# Patient Record
Sex: Female | Born: 1995 | Race: White | Hispanic: No | Marital: Single | Smoking: Never smoker
Health system: Southern US, Community
[De-identification: ages and names within clinical notes are randomized; demographics above are authoritative.]

## PROBLEM LIST (undated history)

## (undated) DIAGNOSIS — J45909 Unspecified asthma, uncomplicated: Secondary | ICD-10-CM

## (undated) DIAGNOSIS — L309 Dermatitis, unspecified: Secondary | ICD-10-CM

## (undated) HISTORY — DX: Dermatitis, unspecified: L30.9

## (undated) HISTORY — PX: NO PAST SURGERIES: SHX2092

---
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
## 2017-01-02 ENCOUNTER — Ambulatory Visit: Payer: BLUE CROSS/BLUE SHIELD | Admitting: Internal Medicine

## 2017-01-04 ENCOUNTER — Ambulatory Visit (INDEPENDENT_AMBULATORY_CARE_PROVIDER_SITE_OTHER): Payer: BLUE CROSS/BLUE SHIELD | Admitting: Internal Medicine

## 2017-01-04 ENCOUNTER — Encounter: Payer: Self-pay | Admitting: Internal Medicine

## 2017-01-04 VITALS — BP 118/70 | HR 87 | Temp 97.9°F | Wt 107.0 lb

## 2017-01-04 DIAGNOSIS — N926 Irregular menstruation, unspecified: Secondary | ICD-10-CM | POA: Diagnosis not present

## 2017-01-04 MED ORDER — NORETHIN ACE-ETH ESTRAD-FE 1-20 MG-MCG PO TABS: 1.0000 | ORAL_TABLET | Freq: Every day | ORAL | 11 refills | Status: DC

## 2017-01-04 NOTE — Patient Instructions (Signed)
Premenstrual Syndrome Premenstrual syndrome (PMS) is a condition that consists of physical, emotional, and behavioral symptoms that affect women of childbearing age. PMS occurs 5-14 days before the start of a menstrual period and often recurs in a predictable pattern. The symptoms go away a few days after the menstrual period starts. PMS can interfere in many ways with normal daily activities and can range from mild to severe. When PMS is considered severe, it may be diagnosed as premenstrual dysphoric disorder (PMDD). A small percentage of women are affected by PMS symptoms and an even smaller percentage of those women are affected by PMDD.  CAUSES  The exact cause of PMS is unknown, but it seems to be related to cyclic hormone changes that happen before menstruation. These hormones are thought to affect chemicals in the brain (serotonin) that can influence a person's mood.  SYMPTOMS  Symptoms of PMS recur consistently from month to month and go away completely after the menstrual period starts. The most common emotional or behavioral symptom is mood swings. These mood swings can be disabling and interfere with normal activities of daily living. Other common symptoms include depression and angry outbursts. Other symptoms may include:   Irritability.  Anxiety.  Crying spells.   Food cravings or appetite changes.   Changes in sexual desire.   Confusion.   Aggression.   Social withdrawal.   Poor concentration. The most common physical symptoms include a sense of bloating, breast pain, headaches, and extreme fatigue. Other physical symptoms include:   Backaches.   Swelling of the hands and feet.   Weight gain.   Hot flashes.  DIAGNOSIS  To make a diagnosis, your caregiver will ask questions to confirm that you are having a pattern of symptoms. Symptoms must:   Be present 5 days before the start of your period and be present at least 3 months in a row.   End within 4 days  after your period starts.   Interfere with some of your normal activities.  Other conditions, such as thyroid disease, depression, and migraine headaches must be ruled out before a diagnosis of PMS is confirmed.  TREATMENT  Your caregiver may suggest ways to maintain a healthy lifestyle, such as exercise. Over-the-counter pain relievers may ease cramps, aches, pains, headaches, and breast tenderness. However, selective serotonin reuptake inhibitors (SSRIs) are medicines that are most beneficial in improving PMS if taken in the second half of the monthly cycle. They may be taken on a daily basis. The most effective oral contraceptive pill used for symptoms of PMS is one that contains the ingredient drospirenone. Taking 4 days off of the pill instead of the usual 7 days also has shown to increase effectiveness.  There are a number of drugs, dietary supplements, vitamins, and water pills (diuretics) which have been suggested to be helpful but have not shown to be of any benefit to improving PMS symptoms.  HOME CARE INSTRUCTIONS   For 2-3 months, write down your symptoms, their severity, and how long they last. This may help your caregiver prescribe the best treatment for your symptoms.  Exercise regularly as suggested by your caregiver.  Eat a regular, well-balanced diet.  Avoid caffeine, alcohol, and tobacco consumption.  Limit salt and salty foods to lessen bloating and fluid retention.  Get enough sleep. Practice relaxation techniques.  Drink enough fluids to keep your urine clear or pale yellow.  Take medicines as directed by your caregiver.  Limit stress.  Take a multivitamin as directed by your   caregiver. This information is not intended to replace advice given to you by your health care provider. Make sure you discuss any questions you have with your health care provider. Document Released: 11/17/2000 Document Revised: 08/14/2012 Document Reviewed: 08/20/2015 Elsevier Interactive  Patient Education  2017 Elsevier Inc.  

## 2017-01-04 NOTE — Progress Notes (Signed)
   Subjective:    Patient ID: Alexis GrayerSophia A Bolton, female    DOB: 01/10/1996, 21 y.o.   MRN: [id number]  HPI  Pt presents to the clinic today with c/o abdominal cramping and nausea. This only occurs during her menstrual cycle. Her periods are regular. She does not have heavy bleeding. Her periods usually last 5-6 days, and reports the cramping and nausea is worst the first and second days. She has vomited and felt faint a few times from the pain. She has tried Ibuprofen and Midol OTC with minimal relief. She reports she is not sexually active at this time.  Review of Systems      Past Medical History:  Diagnosis Date  . Eczema     Current Outpatient Prescriptions  Medication Sig Dispense Refill  . levocetirizine (XYZAL) 5 MG tablet Take 5 mg by mouth every evening.  0  . olopatadine (PATANOL) 0.1 % ophthalmic solution Place 1 drop into both eyes 2 (two) times daily. 5 mL 0  . Pediatric Multiple Vit-C-FA (CHILDRENS MULTIVITAMIN PO) Take 1 tablet by mouth daily.       No current facility-administered medications for this visit.     No Known Allergies  No family history on file.  Social History   Social History  . Marital status: Single    Spouse name: N/A  . Number of children: N/A  . Years of education: N/A   Occupational History  . Not on file.   Social History Main Topics  . Smoking status: Never Smoker  . Smokeless tobacco: Never Used  . Alcohol use No  . Drug use: No  . Sexual activity: Not on file   Other Topics Concern  . Not on file   Social History Narrative   Lives with mom, dad and sister     Constitutional: Denies fever, malaise, fatigue, headache or abrupt weight changes.  Gastrointestinal: Pt reports pelvic cramping and nausea .Denies abdominal pain, bloating, constipation, diarrhea or blood in the stool.  GU: Denies urgency, frequency, pain with urination, burning sensation, blood in urine, odor or discharge.  No other specific complaints in a  complete review of systems (except as listed in HPI above).  Objective:   Physical Exam    BP 118/70   Pulse 87   Temp 97.9 F (36.6 C) (Oral)   Wt 107 lb (48.5 kg)   LMP 01/03/2017   SpO2 98%   BMI 20.55 kg/m  Wt Readings from Last 3 Encounters:  01/04/17 107 lb (48.5 kg) (13 %, Z= -1.10)*  09/14/16 105 lb 6.4 oz (47.8 kg) (12 %, Z= -1.19)*  07/31/16 104 lb 4 oz (47.3 kg) (10 %, Z= -1.26)*   * Growth percentiles are based on CDC 2-20 Years data.    General: Appears her stated age, well developed, well nourished in NAD. Abdomen: Soft and nontender.      Assessment & Plan:   Menstrual problem:  Discussed treatment with OCP She is agreeable to this, understands the risks vs benefits eRx for Junel, will start this Sunday (she is currently on her period)  Follow up with me in 3 months, sooner if needed Nicki ReaperBAITY, Katrina Daddona, NP

## 2017-01-25 ENCOUNTER — Telehealth: Payer: Self-pay

## 2017-01-25 NOTE — Telephone Encounter (Signed)
Pt left v/m pt seen 01/04/17; pt has question about BC; if pt misses BC pill and then pt takes the missed dose pt as dried up blood like aftermath of menstrual period and pt wants to know if she should be concerned. Pt request cb.

## 2017-01-26 NOTE — Telephone Encounter (Signed)
She should not be concerned. This is normal with a missed dose. However, if she misses a dose, she should not try to take it when she remembers, she should just wait for the next dose.

## 2017-01-26 NOTE — Telephone Encounter (Signed)
Left message on voicemail.

## 2017-03-27 DIAGNOSIS — J301 Allergic rhinitis due to pollen: Secondary | ICD-10-CM | POA: Diagnosis not present

## 2017-03-27 DIAGNOSIS — J3081 Allergic rhinitis due to animal (cat) (dog) hair and dander: Secondary | ICD-10-CM | POA: Diagnosis not present

## 2017-03-27 DIAGNOSIS — H1045 Other chronic allergic conjunctivitis: Secondary | ICD-10-CM | POA: Diagnosis not present

## 2017-03-27 DIAGNOSIS — J3089 Other allergic rhinitis: Secondary | ICD-10-CM | POA: Diagnosis not present

## 2017-10-19 ENCOUNTER — Other Ambulatory Visit: Payer: Self-pay | Admitting: Internal Medicine

## 2018-03-26 DIAGNOSIS — J3089 Other allergic rhinitis: Secondary | ICD-10-CM | POA: Diagnosis not present

## 2018-03-26 DIAGNOSIS — J3081 Allergic rhinitis due to animal (cat) (dog) hair and dander: Secondary | ICD-10-CM | POA: Diagnosis not present

## 2018-03-26 DIAGNOSIS — H1045 Other chronic allergic conjunctivitis: Secondary | ICD-10-CM | POA: Diagnosis not present

## 2018-03-26 DIAGNOSIS — J301 Allergic rhinitis due to pollen: Secondary | ICD-10-CM | POA: Diagnosis not present

## 2018-04-24 ENCOUNTER — Ambulatory Visit: Payer: Self-pay | Admitting: *Deleted

## 2018-04-24 ENCOUNTER — Telehealth: Payer: Self-pay

## 2018-04-24 NOTE — Telephone Encounter (Signed)
Copied from CRM [id number]. Topic: Appointment Scheduling - Scheduling Inquiry for Clinic >> Apr 24, 2018  3:38 PM Eston Mould B wrote: Reason for CRM:  Pt  wants to schedule a hepatitis shot

## 2018-04-24 NOTE — Telephone Encounter (Signed)
Please schedule nurse visit for hep A vaccine -and then the booster Thanks

## 2018-04-24 NOTE — Telephone Encounter (Addendum)
Pt wants to schedule Hep A shot. [facility] on Dr Royden Purl desk in her in box. Pt does not plan to go out of the country.Please advise.

## 2018-04-24 NOTE — Telephone Encounter (Signed)
See telephone encounter from 5/22

## 2018-04-25 NOTE — Telephone Encounter (Signed)
I left a message on patient's voice mail for patient to return my call.  I,also, put in a CRM asking [facility] to schedule nurse visit for Hep A and another visit 6 months later for the booster.

## 2018-05-02 ENCOUNTER — Ambulatory Visit (INDEPENDENT_AMBULATORY_CARE_PROVIDER_SITE_OTHER): Payer: BLUE CROSS/BLUE SHIELD

## 2018-05-02 DIAGNOSIS — Z23 Encounter for immunization: Secondary | ICD-10-CM | POA: Diagnosis not present

## 2018-06-03 DIAGNOSIS — J040 Acute laryngitis: Secondary | ICD-10-CM | POA: Diagnosis not present

## 2018-06-14 DIAGNOSIS — H02844 Edema of left upper eyelid: Secondary | ICD-10-CM | POA: Diagnosis not present

## 2018-06-19 ENCOUNTER — Encounter: Payer: Self-pay | Admitting: Family Medicine

## 2018-06-19 ENCOUNTER — Ambulatory Visit (INDEPENDENT_AMBULATORY_CARE_PROVIDER_SITE_OTHER): Payer: BLUE CROSS/BLUE SHIELD | Admitting: Family Medicine

## 2018-06-19 VITALS — BP 104/66 | HR 88 | Temp 98.4°F | Ht 60.5 in | Wt 104.2 lb

## 2018-06-19 DIAGNOSIS — R5382 Chronic fatigue, unspecified: Secondary | ICD-10-CM

## 2018-06-19 DIAGNOSIS — J351 Hypertrophy of tonsils: Secondary | ICD-10-CM

## 2018-06-19 DIAGNOSIS — R5383 Other fatigue: Secondary | ICD-10-CM | POA: Insufficient documentation

## 2018-06-19 DIAGNOSIS — H1010 Acute atopic conjunctivitis, unspecified eye: Secondary | ICD-10-CM | POA: Diagnosis not present

## 2018-06-19 DIAGNOSIS — N946 Dysmenorrhea, unspecified: Secondary | ICD-10-CM

## 2018-06-19 DIAGNOSIS — T781XXS Other adverse food reactions, not elsewhere classified, sequela: Secondary | ICD-10-CM

## 2018-06-19 MED ORDER — DROSPIRENONE-ETHINYL ESTRADIOL 3-0.02 MG PO TABS: 1.0000 | ORAL_TABLET | Freq: Every day | ORAL | 11 refills | Status: DC

## 2018-06-19 NOTE — Patient Instructions (Addendum)
Try to eat more regularly  Meals or snacks with protein - stop skipping / it is making you feel weak and tired  Also we want to prevent weight loss    Get to the allergist as planned   Let's change your oral contraceptive to yaz  If any problems or if insurance does not cover well please let me know  Let me know if not helpful  If any intolerable side effects let me know   Tonsils are large but look ok - alert me if problems with sore throats  Use the allergy eye drop when you need it   If fatigue continues we can do blood work- please let me know

## 2018-06-19 NOTE — Assessment & Plan Note (Signed)
With good mood  Crazy work hours Irregular eating/diet -partially due to food allergies  Disc imp of more frequent meals with protein -will owrk on that  Also change OC  If no imp -consider labs

## 2018-06-19 NOTE — Progress Notes (Signed)
Subjective:    Patient ID: Alexis Bolton, female    DOB: Aug 05, 1996, 22 y.o.   MRN: [id number]  HPI Here for personal issue   Wt Readings from Last 3 Encounters:  06/19/18 104 lb 4 oz (47.3 kg) (7 %, Z= -1.46)*  01/04/17 107 lb (48.5 kg) (13 %, Z= -1.10)*  09/14/16 105 lb 6.4 oz (47.8 kg) (12 %, Z= -1.19)*   * Growth percentiles are based on CDC (Girls, 2-20 Years) data.   20.02 kg/m (28 %, Z= -0.58, Source: CDC (Girls, 2-20 Years))   She wonders if she has weak immune system   Sick a lot lately  Feeling nauseated -but not vomiting (food allergies)  Feels generally weak at times (not focal)  Developed many food allergies in her teens (sees allergist)- difficult to choose meals   Not eating regularly - hard to make meal plan with food allergies  occ not a good appetite - has to force herself to eat  (perhaps from eating less to begin with)  Not trying to loose weight  Does not feel depressed -mood is good Not really stressed   Rene Kocher put her on OC in feb - for periods  Took for over a year- still had bad cramps (sometimes every other month)  junel-fe On the pill her periods lasted 4-5 days (first day a bit heavy)  Out of it since march  Interested in a change  Has never been sexually active-no plans to be    Goes to work 3:15 to 1 am - and it is hard for her to eat at night Works at [employer]- and likes it  Sleeps 8-9 hours - good about that  No etoh or drugs   [facility] tonsils [facility]  Wanted to check this   Her R eye got red and swollen last week  Then better  Used eye drops for allergies  No itching or vision change Some d/c - better now   Patient Active Problem List   Diagnosis Date Noted  . Dysmenorrhea 06/20/2018  . Fatigue 06/19/2018  . Allergic reaction to food 07/31/2016  . Seasonal allergic rhinitis 07/31/2016  . Well adolescent visit 10/12/2015  . Exercise-induced asthma 09/26/2012   Past Medical History:  Diagnosis Date  . Eczema     History reviewed. No pertinent surgical history. Social History   Tobacco Use  . Smoking status: Never Smoker  . Smokeless tobacco: Never Used  Substance Use Topics  . Alcohol use: No    Alcohol/week: 0.0 oz  . Drug use: No   History reviewed. No pertinent family history. Allergies  Allergen Reactions  . Other     Nuts, spirng mix causes itchy throat  . Pomegranate [Punica]     Itchy throat   Current Outpatient Medications on File Prior to Visit  Medication Sig Dispense Refill  . levocetirizine (XYZAL) 5 MG tablet Take 5 mg by mouth every evening.  0  . montelukast (SINGULAIR) 10 MG tablet Take 10 mg by mouth daily.  6  . norethindrone-ethinyl estradiol (JUNEL FE,GILDESS FE,LOESTRIN FE) 1-20 MG-MCG tablet Take 1 tablet by mouth daily. 1 Package 11  . olopatadine (PATANOL) 0.1 % ophthalmic solution Place 1 drop into both eyes 2 (two) times daily. 5 mL 0  . Pediatric Multiple Vit-C-FA (CHILDRENS MULTIVITAMIN PO) Take 1 tablet by mouth daily.       No current facility-administered medications on file prior to visit.  Review of Systems  Constitutional: Positive for fatigue. Negative for activity change, appetite change, fever and unexpected weight change.  HENT: Negative for congestion, ear pain, rhinorrhea, sinus pressure and sore throat.   Eyes: Negative for pain, redness and visual disturbance.  Respiratory: Negative for cough, shortness of breath and wheezing.   Cardiovascular: Negative for chest pain and palpitations.  Gastrointestinal: Negative for abdominal pain, blood in stool, constipation and diarrhea.  Endocrine: Negative for polydipsia and polyuria.  Genitourinary: Positive for menstrual problem. Negative for dysuria, frequency, genital sores and urgency.  Musculoskeletal: Negative for arthralgias, back pain and myalgias.  Skin: Negative for pallor and rash.  Allergic/Immunologic: Negative for environmental allergies.  Neurological: Negative for dizziness,  tremors, seizures, syncope, facial asymmetry, speech difficulty, weakness, light-headedness, numbness and headaches.       At times weak all over-but no focal weakness  Hematological: Negative for adenopathy. Does not bruise/bleed easily.  Psychiatric/Behavioral: Negative for decreased concentration and dysphoric mood. The patient is not nervous/anxious.        Mood is good       Objective:   Physical Exam  Constitutional: She appears well-developed and well-nourished. No distress.  Slim and well appearing   HENT:  Head: Normocephalic and atraumatic.  Right Ear: External ear normal.  Left Ear: External ear normal.  Nose: Nose normal.  Mouth/Throat: Oropharynx is clear and moist. No oropharyngeal exudate.  Boggy nares  Baseline large tonsils No erythema or exudate   Eyes: Pupils are equal, round, and reactive to light. Conjunctivae and EOM are normal. Right eye exhibits no discharge. Left eye exhibits no discharge. No scleral icterus.  No conjunctivitis  Neck: Normal range of motion. Neck supple. No JVD present. Carotid bruit is not present. No thyromegaly present.  Cardiovascular: Normal rate, regular rhythm, normal heart sounds and intact distal pulses. Exam reveals no gallop.  Pulmonary/Chest: Effort normal and breath sounds normal. No respiratory distress. She has no wheezes. She has no rales.  Abdominal: Soft. Bowel sounds are normal. She exhibits no distension and no mass. There is no tenderness. There is no rebound and no guarding. No hernia.  No suprapubic tenderness or fullness    Musculoskeletal: She exhibits no edema or tenderness.  Lymphadenopathy:    She has no cervical adenopathy.  Neurological: She is alert. She has normal reflexes. She displays no tremor and normal reflexes. No cranial nerve deficit. She exhibits normal muscle tone. Coordination normal.  Skin: Skin is warm and dry. No rash noted. No erythema. No pallor.  Psychiatric: She has a normal mood and affect.  Her mood appears not anxious. Her affect is not blunt and not labile. She does not exhibit a depressed mood.  Mood is good           Assessment & Plan:   Problem List Items Addressed This Visit      Genitourinary   Dysmenorrhea    Currently on junel fe  Over a year-not helping as much as she wants with cramping Will try change to Yaz (see if different progesterone helps)  Watch for nausea or other side eff  Has not been sexually active- but aware if she is that OC does not prevent STD/ needs to use condoms Alert if no imp in menstrual cramps        Other   Allergic conjunctivitis    Had a bad episode several weeks ago-tx with patanol drops and improved Picture shows red /swollen eye -worse on L  Nl today  Continue drops prn  F/u with allergist       Allergic reaction to food    Continue allergist f/u and current medicines       Fatigue - Primary    With good mood  Crazy work hours Irregular eating/diet -partially due to food allergies  Disc imp of more frequent meals with protein -will owrk on that  Also change OC  If no imp -consider labs       Large tonsils    Baseline large tonsils  No pain or findings of infection  No symptoms  Reassured pt  Would consider action if she had recurrent infections or ST

## 2018-06-19 NOTE — Assessment & Plan Note (Signed)
Continue allergist f/u and current medicines

## 2018-06-20 DIAGNOSIS — J351 Hypertrophy of tonsils: Secondary | ICD-10-CM | POA: Insufficient documentation

## 2018-06-20 DIAGNOSIS — H1010 Acute atopic conjunctivitis, unspecified eye: Secondary | ICD-10-CM | POA: Insufficient documentation

## 2018-06-20 DIAGNOSIS — N946 Dysmenorrhea, unspecified: Secondary | ICD-10-CM | POA: Insufficient documentation

## 2018-06-20 NOTE — Assessment & Plan Note (Signed)
Had a bad episode several weeks ago-tx with patanol drops and improved Picture shows red /swollen eye -worse on L  Nl today  Continue drops prn  F/u with allergist

## 2018-06-20 NOTE — Assessment & Plan Note (Signed)
Currently on junel fe  Over a year-not helping as much as she wants with cramping Will try change to Yaz (see if different progesterone helps)  Watch for nausea or other side eff  Has not been sexually active- but aware if she is that OC does not prevent STD/ needs to use condoms Alert if no imp in menstrual cramps

## 2018-06-20 NOTE — Assessment & Plan Note (Signed)
Baseline large tonsils  No pain or findings of infection  No symptoms  Reassured pt  Would consider action if she had recurrent infections or ST

## 2018-06-26 ENCOUNTER — Encounter: Payer: Self-pay | Admitting: Family Medicine

## 2018-06-26 ENCOUNTER — Ambulatory Visit (INDEPENDENT_AMBULATORY_CARE_PROVIDER_SITE_OTHER): Payer: BLUE CROSS/BLUE SHIELD | Admitting: Family Medicine

## 2018-06-26 VITALS — BP 96/60 | HR 100 | Temp 98.5°F | Ht 60.5 in | Wt 104.2 lb

## 2018-06-26 DIAGNOSIS — S0502XA Injury of conjunctiva and corneal abrasion without foreign body, left eye, initial encounter: Secondary | ICD-10-CM | POA: Diagnosis not present

## 2018-06-26 MED ORDER — MOXIFLOXACIN HCL (2X DAY) 0.5 % OP SOLN: 1.0000 [drp] | Freq: Two times a day (BID) | OPHTHALMIC | 0 refills | Status: DC

## 2018-06-26 NOTE — Progress Notes (Signed)
Dr. Karleen Hampshire T. Katriana Dortch, MD, CAQ [facility] [address] [place], [address] Phone: [phone] Fax: [phone]  06/26/2018  Patient: Alexis Bolton, MRN: [id number], DOB: 1996-03-30, 22 y.o.  Primary Physician:  Tower, Audrie Gallus, MD   Chief Complaint  Patient presents with  . left eye red and swollen    started Sunday   Subjective:   Alexis Bolton is a 22 y.o. very pleasant female patient who presents with the following:  L eye: deeply injected.  She has no known sick contacts, and she has not had any known sick contacts that have had pinkeye.  She does not wear contacts.  Her eyes have been hurting since Sunday, and they have progressively been getting worse, and this is all on the left side.  It is been burning, hurting, and she has some mild photophobia.  Corneal abrasion  Past Medical History, Surgical History, Social History, Family History, Problem List, Medications, and Allergies have been reviewed and updated if relevant.  Patient Active Problem List   Diagnosis Date Noted  . Dysmenorrhea 06/20/2018  . Allergic conjunctivitis 06/20/2018  . Large tonsils 06/20/2018  . Fatigue 06/19/2018  . Allergic reaction to food 07/31/2016  . Seasonal allergic rhinitis 07/31/2016  . Well adolescent visit 10/12/2015  . Exercise-induced asthma 09/26/2012    Past Medical History:  Diagnosis Date  . Eczema     No past surgical history on file.  Social History   Socioeconomic History  . Marital status: Single    Spouse name: Not on file  . Number of children: Not on file  . Years of education: Not on file  . Highest education level: Not on file  Occupational History  . Not on file  Social Needs  . Financial resource strain: Not on file  . Food insecurity:    Worry: Not on file    Inability: Not on file  . Transportation needs:    Medical: Not on file    Non-medical: Not on file  Tobacco Use  . Smoking status: Never  Smoker  . Smokeless tobacco: Never Used  Substance and Sexual Activity  . Alcohol use: No    Alcohol/week: 0.0 oz  . Drug use: No  . Sexual activity: Not on file  Lifestyle  . Physical activity:    Days per week: Not on file    Minutes per session: Not on file  . Stress: Not on file  Relationships  . Social connections:    Talks on phone: Not on file    Gets together: Not on file    Attends religious service: Not on file    Active member of club or organization: Not on file    Attends meetings of clubs or organizations: Not on file    Relationship status: Not on file  . Intimate partner violence:    Fear of current or ex partner: Not on file    Emotionally abused: Not on file    Physically abused: Not on file    Forced sexual activity: Not on file  Other Topics Concern  . Not on file  Social History Narrative   Lives with mom, dad and sister    No family history on file.  Allergies  Allergen Reactions  . Other     Nuts, spirng mix causes itchy throat  . Pomegranate [Punica]     Itchy throat    Medication list reviewed and updated in full in   Link.   GEN: No acute illnesses, no fevers, chills. GI: No n/v/d, eating normally Pulm: No SOB Interactive and getting along well at home.  Otherwise, ROS is as per the HPI.  Objective:   BP 96/60   Pulse 100   Temp 98.5 F (36.9 C) (Oral)   Ht 5' 0.5" (1.537 m)   Wt 104 lb 4 oz (47.3 kg)   LMP 06/24/2018   BMI 20.02 kg/m   GEN: WDWN, NAD, Non-toxic, A & O x 3 HEENT: Atraumatic, Normocephalic. Neck supple. No masses, No LAD. PERRLA, EOMI. and examination under floor seen showed a left lateral small corneal abrasion. Ears and Nose: No external deformity. CV: RRR, No M/G/R. No JVD. No thrill. No extra heart sounds. PULM: CTA B, no wheezes, crackles, rhonchi. No retractions. No resp. distress. No accessory muscle use. EXTR: No c/c/e NEURO Normal gait.  PSYCH: Normally interactive. Conversant. Not  depressed or anxious appearing.  Calm demeanor.   Laboratory and Imaging Data:  Assessment and Plan:   Corneal abrasion, left, initial encounter  Good to have the patient start moxifloxacin drops 1 drop to each eye on the affected eye twice a day, and she is to continue this for 7 days.  If her pain or coloration in her eye worsens at all, she is to call our office ASAP.  Follow-up: No follow-ups on file.  Meds ordered this encounter  Medications  . Moxifloxacin HCl 0.5 % SOLN    Sig: Apply 1 drop to eye 2 (two) times daily. For 7 days    Dispense:  3 mL    Refill:  0   Signed,  Haruka Kowaleski T. Louise Victory, MD   Allergies as of 06/26/2018      Reactions   Other    Nuts, spirng mix causes itchy throat   Pomegranate [punica]    Itchy throat      Medication List        Accurate as of 06/26/18 11:59 PM. Always use your most recent med list.          CHILDRENS MULTIVITAMIN PO Take 1 tablet by mouth daily.   drospirenone-ethinyl estradiol 3-0.02 MG tablet Commonly known as:  YAZ,GIANVI,LORYNA Take 1 tablet by mouth daily.   levocetirizine 5 MG tablet Commonly known as:  XYZAL Take 5 mg by mouth every evening.   montelukast 10 MG tablet Commonly known as:  SINGULAIR Take 10 mg by mouth daily.   Moxifloxacin HCl 0.5 % Soln Apply 1 drop to eye 2 (two) times daily. For 7 days   olopatadine 0.1 % ophthalmic solution Commonly known as:  PATANOL Place 1 drop into both eyes 2 (two) times daily.

## 2018-07-01 ENCOUNTER — Encounter: Payer: Self-pay | Admitting: Family Medicine

## 2018-10-08 DIAGNOSIS — J301 Allergic rhinitis due to pollen: Secondary | ICD-10-CM | POA: Diagnosis not present

## 2018-10-08 DIAGNOSIS — J3081 Allergic rhinitis due to animal (cat) (dog) hair and dander: Secondary | ICD-10-CM | POA: Diagnosis not present

## 2018-10-08 DIAGNOSIS — J3089 Other allergic rhinitis: Secondary | ICD-10-CM | POA: Diagnosis not present

## 2018-10-08 DIAGNOSIS — H1045 Other chronic allergic conjunctivitis: Secondary | ICD-10-CM | POA: Diagnosis not present

## 2018-11-05 ENCOUNTER — Ambulatory Visit: Payer: BLUE CROSS/BLUE SHIELD

## 2019-05-21 ENCOUNTER — Encounter: Payer: Self-pay | Admitting: Family Medicine

## 2019-05-21 ENCOUNTER — Ambulatory Visit (INDEPENDENT_AMBULATORY_CARE_PROVIDER_SITE_OTHER): Payer: BC Managed Care – PPO | Admitting: Family Medicine

## 2019-05-21 DIAGNOSIS — J301 Allergic rhinitis due to pollen: Secondary | ICD-10-CM | POA: Diagnosis not present

## 2019-05-21 NOTE — Assessment & Plan Note (Signed)
Pt had exacerbation after forgetting medication for 2 days  Nasal symptoms with pnd and dry cough  She got back on medication today (xyzal, singulair, claritin and nasal steroid), and her symptoms are gone  She needs a note to return to work  inst to watch for fever/st/GI symptoms/change in taste or smell or return of cough and let us know as those are potential covid symptoms -she voiced understanding Work note written to return 05/23/19 if she feels better

## 2019-05-21 NOTE — Progress Notes (Signed)
Virtual Visit via Video Note  I connected with Alexis Bolton on 05/21/19 at  3:00 PM EDT by a video enabled telemedicine application and verified that I am speaking with the correct person using two identifiers.  Location: Patient: home Provider: office    I discussed the limitations of evaluation and management by telemedicine and the availability of in person appointments. The patient expressed understanding and agreed to proceed.  History of Present Illness: Pt presents for allergy symptoms/in need of a work note  She forgot her allergy medicines for a few days Has had pnd and nasal congestion  Has a little cough   All mucous is clear  Cough was from pnd  No fever  No facial pain or sinus pressure  Throat is not sore   Not feeling bad No n/v/d  No loss of taste or smell   This am she took an equate allergy relief pill (claritin)   She uses a nasal spray - but left it at home (is currently at boyfriend's house)   xyzal 5 mg in evening  singulair 10 mg daiy in evening   Only uses patanol eye drops occasionally   Her allergy appointment is in October   Her usual bad season- is spring to summer     Went to work  (she works at [employer] and [employer]) Can pick up letters tomorrow  Wants to return to work Friday   Review of Systems  Constitutional: Negative for chills, diaphoresis, fever, malaise/fatigue and weight loss.  HENT: Negative for congestion, sinus pain and sore throat.        Pnd is better now   Eyes: Negative for discharge.  Respiratory: Negative for cough, sputum production, shortness of breath and wheezing.   Cardiovascular: Negative for chest pain and palpitations.  Gastrointestinal: Negative for diarrhea and nausea.  Musculoskeletal: Negative for myalgias.  Neurological: Negative for dizziness and headaches.    Patient Active Problem List   Diagnosis Date Noted  . Dysmenorrhea 06/20/2018  . Allergic conjunctivitis 06/20/2018  . Large  tonsils 06/20/2018  . Fatigue 06/19/2018  . Allergic reaction to food 07/31/2016  . Seasonal allergic rhinitis 07/31/2016  . Well adolescent visit 10/12/2015  . Exercise-induced asthma 09/26/2012   Past Medical History:  Diagnosis Date  . Eczema    History reviewed. No pertinent surgical history. Social History   Tobacco Use  . Smoking status: Never Smoker  . Smokeless tobacco: Never Used  Substance Use Topics  . Alcohol use: No    Alcohol/week: 0.0 standard drinks  . Drug use: No   History reviewed. No pertinent family history. Allergies  Allergen Reactions  . Other     Nuts, spirng mix causes itchy throat  . Pomegranate [[place]]     Itchy throat   Current Outpatient Medications on File Prior to Visit  Medication Sig Dispense Refill  . drospirenone-ethinyl estradiol (YAZ,GIANVI,LORYNA) 3-0.02 MG tablet Take 1 tablet by mouth daily. 1 Package 11  . levocetirizine (XYZAL) 5 MG tablet Take 5 mg by mouth every evening.  0  . montelukast (SINGULAIR) 10 MG tablet Take 10 mg by mouth daily.  6  . olopatadine (PATANOL) 0.1 % ophthalmic solution Place 1 drop into both eyes 2 (two) times daily. 5 mL 0  . Pediatric Multiple Vit-C-FA (CHILDRENS MULTIVITAMIN PO) Take 1 tablet by mouth daily.       No current facility-administered medications on file prior to visit.     Observations/Objective: Patient appears well, in no  distress Weight is baseline  No facial swelling or asymmetry Normal voice-not hoarse and no slurred speech No obvious tremor or mobility impairment Moving neck and UEs normally Able to hear the call well  No cough or shortness of breath during interview  Not sniffling and does not sound congested  Talkative and mentally sharp with no cognitive changes No skin changes on face or neck , no rash or pallor Affect is normal    Assessment and Plan: Problem List Items Addressed This Visit      Respiratory   Seasonal allergic rhinitis - Primary    Pt had  exacerbation after forgetting medication for 2 days  Nasal symptoms with pnd and dry cough  She got back on medication today (xyzal, singulair, claritin and nasal steroid), and her symptoms are gone  She needs a note to return to work  inst to watch for fever/st/GI symptoms/change in taste or smell or return of cough and let us know as those are potential covid symptoms -she voiced understanding Work note written to return 05/23/19 if she feels better           Follow Up Instructions:    I discussed the assessment and treatment plan with the patient. The patient was provided an opportunity to ask questions and all were answered. The patient agreed with the plan and demonstrated an understanding of the instructions.   The patient was advised to call back or seek an in-person evaluation if the symptoms worsen or if the condition fails to improve as anticipated.     Roxy MannsMarne Tower, MD

## 2020-05-26 ENCOUNTER — Other Ambulatory Visit: Payer: Self-pay

## 2020-05-26 ENCOUNTER — Ambulatory Visit (INDEPENDENT_AMBULATORY_CARE_PROVIDER_SITE_OTHER): Payer: BC Managed Care – PPO | Admitting: Family Medicine

## 2020-05-26 ENCOUNTER — Encounter: Payer: Self-pay | Admitting: Family Medicine

## 2020-05-26 DIAGNOSIS — S70362A Insect bite (nonvenomous), left thigh, initial encounter: Secondary | ICD-10-CM

## 2020-05-26 DIAGNOSIS — W57XXXA Bitten or stung by nonvenomous insect and other nonvenomous arthropods, initial encounter: Secondary | ICD-10-CM | POA: Diagnosis not present

## 2020-05-26 MED ORDER — TRIAMCINOLONE ACETONIDE 0.1 % EX CREA: 1.0000 "application " | TOPICAL_CREAM | Freq: Two times a day (BID) | CUTANEOUS | 0 refills | Status: DC

## 2020-05-26 NOTE — Progress Notes (Signed)
This visit was conducted in person.  BP 124/78   Pulse 91   Ht 5' 0.5" (1.537 m)   Wt 116 lb (52.6 kg)   SpO2 99%   BMI 22.28 kg/m    CC: mosquito bite Subjective:    Patient ID: Alexis Bolton Reason, female    DOB: September 05, 1996, 24 y.o.   MRN: [id number]  HPI: Alexis Bolton is a 24 y.o. female presenting on 05/26/2020 for Insect Bite   2d ago had small bug bite, she cleaned with rubbing alcohol and petroleum jelly. Yesterday and today it seemed to swell and become more irritated. More itchy as well.   No fevers/chills. No streaking redness. No symptoms of anaphylaxis. No know allergies to bug bites but she does tend to have local reaction to bug bites in the past.      Relevant past medical, surgical, family and social history reviewed and updated as indicated. Interim medical history since our last visit reviewed. Allergies and medications reviewed and updated. Outpatient Medications Prior to Visit  Medication Sig Dispense Refill  . drospirenone-ethinyl estradiol (YAZ,GIANVI,LORYNA) 3-0.02 MG tablet Take 1 tablet by mouth daily. 1 Package 11  . levocetirizine (XYZAL) 5 MG tablet Take 5 mg by mouth every evening.  0  . montelukast (SINGULAIR) 10 MG tablet Take 10 mg by mouth daily.  6  . olopatadine (PATANOL) 0.1 % ophthalmic solution Place 1 drop into both eyes 2 (two) times daily. 5 mL 0  . Pediatric Multiple Vit-C-FA (CHILDRENS MULTIVITAMIN PO) Take 1 tablet by mouth daily.       No facility-administered medications prior to visit.     Per HPI unless specifically indicated in ROS section below Review of Systems Objective:  BP 124/78   Pulse 91   Ht 5' 0.5" (1.537 m)   Wt 116 lb (52.6 kg)   SpO2 99%   BMI 22.28 kg/m   Wt Readings from Last 3 Encounters:  05/26/20 116 lb (52.6 kg)  06/26/18 104 lb 4 oz (47.3 kg) (7 %, Z= -1.46)*  06/19/18 104 lb 4 oz (47.3 kg) (7 %, Z= -1.46)*   * Growth percentiles are based on CDC (Girls, 2-20 Years) data.      Physical  Exam Vitals and nursing note reviewed.  Constitutional:      Appearance: Normal appearance. She is not ill-appearing.  Skin:    General: Skin is warm and dry.     Findings: Erythema present.          Comments: Left lateral thigh with circular area of warmth, edema, erythema surrounding bug bite (not well demarcated)  Neurological:     Mental Status: She is alert.  Psychiatric:        Mood and Affect: Mood normal.        Behavior: Behavior normal.       Assessment & Plan:  This visit occurred during the SARS-CoV-2 public health emergency.  Safety protocols were in place, including screening questions prior to the visit, additional usage of staff PPE, and extensive cleaning of exam room while observing appropriate contact time as indicated for disinfecting solutions.   Problem List Items Addressed This Visit    Insect bite of left thigh with local reaction    Inflammatory local response to insect bite suffered 2 days ago. Not consistent with infection/cellulitis. Will treat with TCI cream, cool compress. Red flags to notify us suggesting infection reviewed. Pt agrees with plan.  Meds ordered this encounter  Medications  . triamcinolone cream (KENALOG) 0.1 %    Sig: Apply 1 application topically 2 (two) times daily. Apply to AA.    Dispense:  30 g    Refill:  0   No orders of the defined types were placed in this encounter.   Patient instructions: Looks like you had local reaction to bug bite - but doesn't look infected. Treat with topical steroid cream sent to pharmacy.  Use cool compresses to the area.  Watch for streaking redness, or other signs of infection like fever, nausea.   Follow up plan: Return if symptoms worsen or fail to improve.  Eustaquio Boyden, MD

## 2020-05-26 NOTE — Patient Instructions (Signed)
Looks like you had local reaction to bug bite - but doesn't look infected. Treat with topical steroid cream sent to pharmacy.  Use cool compresses to the area.  Watch for streaking redness, or other signs of infection like fever, nausea.   Insect Bite, Adult An insect bite can make your skin red, itchy, and swollen. An insect bite is different from an insect sting, which happens when an insect injects poison (venom) into the skin. Some insects can spread disease to people through a bite. However, most insect bites do not lead to disease and are not serious. What are the causes? Insects may bite for a variety of reasons, including:  Hunger.  To defend themselves. Insects that bite include:  Spiders.  Mosquitoes.  Ticks.  Fleas.  Ants.  Flies.  Kissing bugs.  Chiggers. What are the signs or symptoms? Symptoms of this condition include:  Itching or pain in the bite area.  Redness and swelling in the bite area.  An open wound (skin ulcer). In many cases, symptoms last for 2-4 days. In rare cases, a person may have a severe allergic reaction (anaphylactic reaction) to a bite. Symptoms of an anaphylactic reaction may include:  Feeling warm in the face (flushed). This may include redness.  Itchy, red, swollen areas of skin (hives).  Swelling of the eyes, lips, face, mouth, tongue, or throat.  Difficulty breathing, speaking, or swallowing.  Noisy breathing (wheezing).  Dizziness or light-headedness.  Fainting.  Pain or cramping in the abdomen.  Vomiting.  Diarrhea. How is this diagnosed? This condition is usually diagnosed based on symptoms and a physical exam. How is this treated? Treatment is usually not needed. Symptoms often go away on their own. When treatment is recommended, it may involve:  Applying a cream or lotion to the bite area. This treatment helps with itching.  Taking an antibiotic medicine. This treatment is needed if the bite area gets  infected.  Getting a tetanus shot, if you are not up to date on this vaccine.  Applying ice to the affected area.  Allergy medicines called antihistamines. This treatment may be needed if you develop itching or an allergic reaction to the insect bite.  Giving yourself an epinephrine injection if you have an anaphylactic reaction to a bite. To give the injection, you will use what is commonly called an auto-injector "pen" (pre-filled automatic epinephrine injection device). Your health care provider will teach you how to use an auto-injector pen. Follow these instructions at home: Bite area care   Do not scratch the bite area.  Keep the bite area clean and dry. Wash it every day with soap and water as told by your health care provider.  Check the bite area every day for signs of infection. Check for: ? Redness, swelling, or pain. ? Fluid or blood. ? Warmth. ? Pus or a bad smell. Managing pain, itching, and swelling   You may apply cortisone cream, calamine lotion, or a paste made of baking soda and water to the bite area as told by your health care provider.  If directed, put ice on the bite area. ? Put ice in a plastic bag. ? Place a towel between your skin and the bag. ? Leave the ice on for 20 minutes, 2-3 times a day. General instructions  Apply or take over-the-counter and prescription medicines only as told by your health care provider.  If you were prescribed an antibiotic medicine, take or apply it as told by your health care  provider. Do not stop using the antibiotic even if your condition improves.  Keep all follow-up visits as told by your health care provider. This is important. How is this prevented? To help reduce your risk of insect bites:  When you are outdoors, wear clothing that covers your arms and legs. This is especially important in the early morning and evening.  Use insect repellent. The best insect repellents contain DEET, picaridin, oil of lemon  eucalyptus (OLE), or IR3535.  Consider spraying your clothing with a pesticide called permethrin. Permethrin helps prevent insect bites. It works for several weeks and for up to 5-6 clothing washes. Do not apply permethrin directly to the skin.  If your home windows do not have screens, consider installing them.  If you will be sleeping in an area where there are mosquitoes, consider covering your sleeping area with a mosquito net. Contact a health care provider if:  You have redness, swelling, or pain in the bite area.  You have fluid or blood coming from the bite area.  The bite area feels warm to the touch.  You have pus or a bad smell coming from the bite area.  You have a fever. Get help right away if:  You have joint pain.  You have a rash.  You feel unusually tired or sleepy.  You have neck pain.  You have a headache.  You have unusual weakness.  You develop symptoms of an anaphylactic reaction. These may include: ? Flushed skin. ? Hives. ? Swelling of the eyes, lips, face, mouth, tongue, or throat. ? Difficulty breathing, speaking, or swallowing. ? Wheezing. ? Dizziness or light-headedness. ? Fainting. ? Pain or cramping in the abdomen. ? Vomiting. ? Diarrhea. These symptoms may represent a serious problem that is an emergency. Do not wait to see if the symptoms will go away. Do the following right away:  Use the auto-injector pen as you have been instructed.  Get medical help. Call your local emergency services (911 in the [place] Do not drive yourself to the hospital. Summary  An insect bite can make your skin red, itchy, and swollen.  Treatment is usually not needed. Symptoms often go away on their own. When treatment is recommended, it may involve taking medicine, applying medicine to the area, or applying ice.  Apply or take over-the-counter and prescription medicines only as told by your health care provider.  Use insect repellent to help prevent  insect bites.  Contact a health care provider if you have any signs of infection in the bite area. This information is not intended to replace advice given to you by your health care provider. Make sure you discuss any questions you have with your health care provider. Document Revised: 05/31/2018 Document Reviewed: 05/31/2018 Elsevier Patient Education  2020 [employer].

## 2020-05-26 NOTE — Assessment & Plan Note (Signed)
Inflammatory local response to insect bite suffered 2 days ago. Not consistent with infection/cellulitis. Will treat with TCI cream, cool compress. Red flags to notify us suggesting infection reviewed. Pt agrees with plan.

## 2020-08-24 ENCOUNTER — Ambulatory Visit (INDEPENDENT_AMBULATORY_CARE_PROVIDER_SITE_OTHER): Payer: BC Managed Care – PPO | Admitting: Family Medicine

## 2020-08-24 ENCOUNTER — Other Ambulatory Visit (HOSPITAL_COMMUNITY)
Admission: RE | Admit: 2020-08-24 | Discharge: 2020-08-24 | Disposition: A | Discharge status: Home / Self Care | Payer: BC Managed Care – PPO | Source: Ambulatory Visit | Attending: Family Medicine | Admitting: Family Medicine

## 2020-08-24 ENCOUNTER — Encounter: Payer: Self-pay | Admitting: Family Medicine

## 2020-08-24 ENCOUNTER — Other Ambulatory Visit: Payer: Self-pay

## 2020-08-24 VITALS — BP 116/70 | HR 84 | Temp 97.4°F | Ht 61.0 in | Wt 110.5 lb

## 2020-08-24 DIAGNOSIS — Z01419 Encounter for gynecological examination (general) (routine) without abnormal findings: Secondary | ICD-10-CM

## 2020-08-24 DIAGNOSIS — N946 Dysmenorrhea, unspecified: Secondary | ICD-10-CM

## 2020-08-24 DIAGNOSIS — Z0000 Encounter for general adult medical examination without abnormal findings: Secondary | ICD-10-CM

## 2020-08-24 DIAGNOSIS — Z113 Encounter for screening for infections with a predominantly sexual mode of transmission: Secondary | ICD-10-CM | POA: Diagnosis not present

## 2020-08-24 DIAGNOSIS — Z23 Encounter for immunization: Secondary | ICD-10-CM

## 2020-08-24 DIAGNOSIS — T781XXS Other adverse food reactions, not elsewhere classified, sequela: Secondary | ICD-10-CM

## 2020-08-24 LAB — COMPREHENSIVE METABOLIC PANEL
ALT: 8 U/L (ref 0–35)
AST: 11 U/L (ref 0–37)
Albumin: 4.8 g/dL (ref 3.5–5.2)
Alkaline Phosphatase: 43 U/L (ref 39–117)
BUN: 12 mg/dL (ref 6–23)
CO2: 28 mEq/L (ref 19–32)
Calcium: 9.7 mg/dL (ref 8.4–10.5)
Chloride: 102 mEq/L (ref 96–112)
Creatinine, Ser: 0.75 mg/dL (ref 0.40–1.20)
GFR: 96.59 mL/min (ref >60.00)
Glucose, Bld: 81 mg/dL (ref 70–99)
Potassium: 3.9 mEq/L (ref 3.5–5.1)
Sodium: 137 mEq/L (ref 135–145)
Total Bilirubin: 0.7 mg/dL (ref 0.2–1.2)
Total Protein: 7.2 g/dL (ref 6.0–8.3)

## 2020-08-24 LAB — CBC WITH DIFFERENTIAL/PLATELET
Basophils Absolute: 0 10*3/uL (ref 0.0–0.1)
Basophils Relative: 0.6 % (ref 0.0–3.0)
Eosinophils Absolute: 0.1 10*3/uL (ref 0.0–0.7)
Eosinophils Relative: 1.9 % (ref 0.0–5.0)
HCT: 37.9 % (ref 36.0–46.0)
Hemoglobin: 13.3 g/dL (ref 12.0–15.0)
Lymphocytes Relative: 41.7 % (ref 12.0–46.0)
Lymphs Abs: 2.1 10*3/uL (ref 0.7–4.0)
MCHC: 35 g/dL (ref 30.0–36.0)
MCV: 89.5 fl (ref 78.0–100.0)
Monocytes Absolute: 0.5 10*3/uL (ref 0.1–1.0)
Monocytes Relative: 9.2 % (ref 3.0–12.0)
Neutro Abs: 2.3 10*3/uL (ref 1.4–7.7)
Neutrophils Relative %: 46.6 % (ref 43.0–77.0)
Platelets: 270 10*3/uL (ref 150.0–400.0)
RBC: 4.24 Mil/uL (ref 3.87–5.11)
RDW: 13.2 % (ref 11.5–15.5)
WBC: 4.9 10*3/uL (ref 4.0–10.5)

## 2020-08-24 LAB — LIPID PANEL
Cholesterol: 155 mg/dL (ref 0–200)
HDL: 49.9 mg/dL (ref >39.00)
LDL Cholesterol: 92 mg/dL (ref 0–99)
NonHDL: 105.01
Total CHOL/HDL Ratio: 3
Triglycerides: 66 mg/dL (ref 0.0–149.0)
VLDL: 13.2 mg/dL (ref 0.0–40.0)

## 2020-08-24 LAB — TSH: TSH: 1.03 u[IU]/mL (ref 0.35–4.50)

## 2020-08-24 NOTE — Assessment & Plan Note (Signed)
First pap and gyn exam done Dysmenorrhea is improved by OC (also for contraception) Std screen added  No complaints First hpv vaccine given today

## 2020-08-24 NOTE — Assessment & Plan Note (Signed)
Reviewed health habits including diet and exercise and skin cancer prevention Reviewed appropriate screening tests for age  Also reviewed health mt list, fam hx and immunization status , as well as social and family history   See HPI Lab ordered STD screening done Gyn exam with first pap done Counseled re: self care/diet/exercise  Pt declines flu shot Declines covid vaccine-questions answered and enc her to consider it  HPV vaccine given

## 2020-08-24 NOTE — Progress Notes (Signed)
Subjective:    Patient ID: Alexis Bolton, female    DOB: 08-02-1996, 24 y.o.   MRN: [id number]  This visit occurred during the SARS-CoV-2 public health emergency.  Safety protocols were in place, including screening questions prior to the visit, additional usage of staff PPE, and extensive cleaning of exam room while observing appropriate contact time as indicated for disinfecting solutions.    HPI  Here for health maintenance exam and to review chronic medical problems    Wt Readings from Last 3 Encounters:  08/24/20 110 lb 8 oz (50.1 kg)  05/26/20 116 lb (52.6 kg)  06/26/18 104 lb 4 oz (47.3 kg) (7 %, Z= -1.46)*   * Growth percentiles are based on CDC (Girls, 2-20 Years) data.   20.88 kg/m  No addn exercise besides work Diet is pretty healthy   (allergy food issues) - working on that  Works at [employer]  Has been there for several years   Td 3/11 Flu shot -declines  covid status - has not had covid or a vaccine   Not interested in covid shot  She says she cannot get allergy shots for 2 weeks after -and wants to avoid it   HPV status -is interested in vaccine    Hep C screen  HIV screen   Pap  Taking yaz (generic) OC for dysmenorrhea  Sexually active- lives with boyfriend  -at her parents house Has been together 3 years  Menses- cramps are improved  Last bad cramps 2 mo ago- doing better now  First day is heavier , usually lasts 4 days   Wants to stay on this pill   Will do first pap today   Wants to do STD screening   Self breast exam -no lumps     Patient Active Problem List   Diagnosis Date Noted  . Routine general medical examination at a health care facility 08/24/2020  . Visit for routine gyn exam 08/24/2020  . Screening examination for STD (sexually transmitted disease) 08/24/2020  . Dysmenorrhea 06/20/2018  . Allergic conjunctivitis 06/20/2018  . Large tonsils 06/20/2018  . Allergic reaction to food 07/31/2016  . Seasonal allergic rhinitis  07/31/2016  . Well adolescent visit 10/12/2015  . Exercise-induced asthma 09/26/2012   Past Medical History:  Diagnosis Date  . Eczema    History reviewed. No pertinent surgical history. Social History   Tobacco Use  . Smoking status: Never Smoker  . Smokeless tobacco: Never Used  Substance Use Topics  . Alcohol use: No    Alcohol/week: 0.0 standard drinks  . Drug use: No   History reviewed. No pertinent family history. Allergies  Allergen Reactions  . Other     Nuts, spirng mix causes itchy throat  . Pomegranate [Punica]     Itchy throat   Current Outpatient Medications on File Prior to Visit  Medication Sig Dispense Refill  . drospirenone-ethinyl estradiol (YAZ,GIANVI,LORYNA) 3-0.02 MG tablet Take 1 tablet by mouth daily. 1 Package 11  . levocetirizine (XYZAL) 5 MG tablet Take 5 mg by mouth every evening.  0  . montelukast (SINGULAIR) 10 MG tablet Take 10 mg by mouth daily.  6  . olopatadine (PATANOL) 0.1 % ophthalmic solution Place 1 drop into both eyes 2 (two) times daily. 5 mL 0  . Pediatric Multiple Vit-C-FA (CHILDRENS MULTIVITAMIN PO) Take 1 tablet by mouth daily.      Marland Kitchen triamcinolone cream (KENALOG) 0.1 % Apply 1 application topically 2 (two) times daily. Apply to AA. 30  g 0   No current facility-administered medications on file prior to visit.    Review of Systems  Constitutional: Negative for activity change, appetite change, fatigue, fever and unexpected weight change.  HENT: Negative for congestion, ear pain, rhinorrhea, sinus pressure and sore throat.   Eyes: Negative for pain, redness and visual disturbance.  Respiratory: Negative for cough, shortness of breath and wheezing.   Cardiovascular: Negative for chest pain and palpitations.  Gastrointestinal: Negative for abdominal pain, blood in stool, constipation and diarrhea.  Endocrine: Negative for polydipsia and polyuria.  Genitourinary: Negative for dysuria, frequency, pelvic pain, urgency, vaginal  discharge and vaginal pain.  Musculoskeletal: Negative for arthralgias, back pain and myalgias.  Skin: Negative for pallor and rash.  Allergic/Immunologic: Negative for environmental allergies.  Neurological: Negative for dizziness, syncope and headaches.  Hematological: Negative for adenopathy. Does not bruise/bleed easily.  Psychiatric/Behavioral: Negative for decreased concentration and dysphoric mood. The patient is not nervous/anxious.        Objective:   Physical Exam Constitutional:      General: She is not in acute distress.    Appearance: Normal appearance. She is well-developed and normal weight. She is not ill-appearing or diaphoretic.  HENT:     Head: Normocephalic and atraumatic.     Right Ear: Tympanic membrane, ear canal and external ear normal.     Left Ear: Tympanic membrane, ear canal and external ear normal.     Nose: Nose normal. No congestion.     Mouth/Throat:     Mouth: Mucous membranes are moist.     Pharynx: Oropharynx is clear. No posterior oropharyngeal erythema.  Eyes:     General: No scleral icterus.    Extraocular Movements: Extraocular movements intact.     Conjunctiva/sclera: Conjunctivae normal.     Pupils: Pupils are equal, round, and reactive to light.  Neck:     Thyroid: No thyromegaly.     Vascular: No carotid bruit or JVD.  Cardiovascular:     Rate and Rhythm: Normal rate and regular rhythm.     Pulses: Normal pulses.     Heart sounds: Normal heart sounds. No gallop.   Pulmonary:     Effort: Pulmonary effort is normal. No respiratory distress.     Breath sounds: Normal breath sounds. No wheezing.     Comments: Good air exch Chest:     Chest wall: No tenderness.  Abdominal:     General: Bowel sounds are normal. There is no distension or abdominal bruit.     Palpations: Abdomen is soft. There is no mass.     Tenderness: There is no abdominal tenderness.     Hernia: No hernia is present.  Genitourinary:    Comments: Breast exam: No  mass, nodules, thickening, tenderness, bulging, retraction, inflamation, nipple discharge or skin changes noted.  No axillary or clavicular LA.              Anus appears normal w/o hemorrhoids or masses     External genitalia : nl appearance and hair distribution/no lesions     Urethral meatus : nl size, no lesions or prolapse     Urethra: no masses, tenderness or scarring    Bladder : no masses or tenderness     Vagina: nl general appearance, no discharge or  Lesions, no significant cystocele  or rectocele     Cervix: no lesions/ discharge or friability    Uterus: nl size, contour, position, and mobility (not fixed) , non tender  Adnexa : no masses, tenderness, enlargement or nodularity       Musculoskeletal:        General: No tenderness. Normal range of motion.     Cervical back: Normal range of motion and neck supple. No rigidity. No muscular tenderness.     Right lower leg: No edema.     Left lower leg: No edema.  Lymphadenopathy:     Cervical: No cervical adenopathy.  Skin:    General: Skin is warm and dry.     Coloration: Skin is not pale.     Findings: No erythema or rash.     Comments: Solar lentigines diffusely   Neurological:     Mental Status: She is alert. Mental status is at baseline.     Cranial Nerves: No cranial nerve deficit.     Motor: No abnormal muscle tone.     Coordination: Coordination normal.     Gait: Gait normal.     Deep Tendon Reflexes: Reflexes are normal and symmetric. Reflexes normal.  Psychiatric:        Mood and Affect: Mood normal.        Cognition and Memory: Cognition and memory normal.     Comments: Pleasant            Assessment & Plan:   Problem List Items Addressed This Visit      Genitourinary   Dysmenorrhea    Helped by OC -generic of yaz  Pt wants to stay on this  Pelvic exam done         Other   Allergic reaction to food    Getting allergy immunotherapy right now      Routine general  medical examination at a health care facility - Primary    Reviewed health habits including diet and exercise and skin cancer prevention Reviewed appropriate screening tests for age  Also reviewed health mt list, fam hx and immunization status , as well as social and family history   See HPI Lab ordered STD screening done Gyn exam with first pap done Counseled re: self care/diet/exercise  Pt declines flu shot Declines covid vaccine-questions answered and enc her to consider it  HPV vaccine given        Relevant Orders   CBC with Differential/Platelet (Completed)   Comprehensive metabolic panel (Completed)   Lipid panel (Completed)   TSH (Completed)   HPV 9-valent vaccine,Recombinat (Completed)   Visit for routine gyn exam    First pap and gyn exam done Dysmenorrhea is improved by OC (also for contraception) Std screen added  No complaints First hpv vaccine given today       Relevant Orders   Cytology - PAP([place])   Screening examination for STD (sexually transmitted disease)    Pt is monogamous  Gc/chlam tests done with pap  Hiv, rpr and hep c with labs  No symptoms        Relevant Orders   HIV Antibody (routine testing w rflx)   Hepatitis C antibody   RPR    Other Visit Diagnoses    Need for HPV vaccination       Relevant Orders   HPV 9-valent vaccine,Recombinat (Completed)

## 2020-08-24 NOTE — Patient Instructions (Addendum)
COVID-19 Vaccine Information can be found at: [email or web address] For questions related to vaccine distribution or appointments, please email [email or web address] or call [phone].     First HPV vaccine today  Also labs for wellness and STD screening  Also pap - we will get you results when they return  Take care of yourself  Continue your current oral contraceptive

## 2020-08-24 NOTE — Assessment & Plan Note (Signed)
Helped by OC -generic of yaz  Pt wants to stay on this  Pelvic exam done

## 2020-08-24 NOTE — Assessment & Plan Note (Signed)
Pt is monogamous  Gc/chlam tests done with pap  Hiv, rpr and hep c with labs  No symptoms

## 2020-08-24 NOTE — Assessment & Plan Note (Signed)
Getting allergy immunotherapy right now

## 2020-08-25 LAB — HEPATITIS C ANTIBODY
Hepatitis C Ab: NONREACTIVE
SIGNAL TO CUT-OFF: 0.04 (ref <1.00)

## 2020-08-25 LAB — HIV ANTIBODY (ROUTINE TESTING W REFLEX): HIV 1&2 Ab, 4th Generation: NONREACTIVE

## 2020-08-25 LAB — RPR: RPR Ser Ql: NONREACTIVE

## 2020-08-26 LAB — CYTOLOGY - PAP
Chlamydia: NEGATIVE
Comment: NEGATIVE
Comment: NEGATIVE
Comment: NORMAL
Diagnosis: NEGATIVE
High risk HPV: NEGATIVE
Neisseria Gonorrhea: NEGATIVE

## 2020-08-27 ENCOUNTER — Encounter: Payer: Self-pay | Admitting: *Deleted

## 2020-09-07 ENCOUNTER — Other Ambulatory Visit: Payer: Self-pay

## 2020-09-07 ENCOUNTER — Encounter: Payer: Self-pay | Admitting: Family Medicine

## 2020-09-07 ENCOUNTER — Ambulatory Visit (INDEPENDENT_AMBULATORY_CARE_PROVIDER_SITE_OTHER): Payer: BC Managed Care – PPO | Admitting: Family Medicine

## 2020-09-07 ENCOUNTER — Ambulatory Visit (INDEPENDENT_AMBULATORY_CARE_PROVIDER_SITE_OTHER)
Admission: RE | Admit: 2020-09-07 | Discharge: 2020-09-07 | Disposition: A | Discharge status: Home / Self Care | Payer: BC Managed Care – PPO | Source: Ambulatory Visit | Attending: Family Medicine | Admitting: Family Medicine

## 2020-09-07 VITALS — BP 106/64 | HR 82 | Temp 97.4°F | Ht 61.0 in | Wt 110.1 lb

## 2020-09-07 DIAGNOSIS — S0992XA Unspecified injury of nose, initial encounter: Secondary | ICD-10-CM

## 2020-09-07 DIAGNOSIS — M542 Cervicalgia: Secondary | ICD-10-CM

## 2020-09-07 NOTE — Patient Instructions (Signed)
Voltaren/diclofenac gel 1% is available over the counter  You can use on neck as needed  Also heat/ice Use your new pillow   Xray now   We will call re: a PT referral
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
## 2020-10-26 ENCOUNTER — Ambulatory Visit (INDEPENDENT_AMBULATORY_CARE_PROVIDER_SITE_OTHER): Payer: BC Managed Care – PPO

## 2020-10-26 ENCOUNTER — Other Ambulatory Visit: Payer: Self-pay

## 2020-10-26 DIAGNOSIS — Z23 Encounter for immunization: Secondary | ICD-10-CM

## 2020-12-04 NOTE — L&D Delivery Note (Signed)
PREOPERATIVE DIAGNOSES: 1. Term pregnancy at [redacted]wks Gestational Age 23. Maternal Exhaustion    POSTOPERATIVE DIAGNOSES: 1. Term pregnancy at [redacted]wks Gestational Age 23. Live, Viable female infant 3. Maternal Exhaustion    OPERATION PERFORMED: Vacuum-Assisted Vaginal Delivery   SURGEON: Annamarie Major, MD   ANESTHESIA: Epidural   ESTIMATED BLOOD LOSS: 300 cc   FINDINGS: Delivered a female infant "LUNA" weighing 7lbs 1oz with Apgars 8 and 9, three-vessel cord and normal placenta.   COMPLICATIONS: None   SITUATION:  The options for labor management at his time were discussed with the patient and her partner, as were the delivery options including a Kiwi vacuum delivery.  She has been in second stage, with epidural and adequate pushing, for >2.5 hours.  The pros and cons and the risks of the vacuum delivery were discussed in detail, as were the alternative approaches. The patient and her partner decided to proceed with a Kiwi vacuum delivery.    DESCRIPTION OF THE PROCEDURE:    The baby's head was confirmed to be in the OA presentation with 100% effacement and +3 station. The bladder was drained. The vacuum was placed and the correct placement in front of the posterior fontanelle was confirmed digitally. With the patient's next contraction, the vacuum was inflated and a gentle pressure was used to assist with maternal pushes to deliver the baby's head. In total there were 5pulls and 1 pop-offs. The vacuum was released between contractions.  Flat Kiwi was used 3 times, then [employer] one time, then an interval of pushing, then the [employer] one final time w delivery.   With crowning of the head the perineum was injected with 1% lidocaine.  After delivery of the head, the vacuum was deflated and removed. There was a single nuchal cord.  The anterior  shoulder was delivered with gentle downward guidance followed by delivery of the posterior shoulder with gentle upward guidance. The infant was placed on the  maternal chest.  The cord was clamped x2 and cut. The infant was handed to the NICU attendants.   Pitocin was added to the patient's IV fluids. The placenta delivered spontaneously, was intact and had a three-vessel cord. A vaginal inspection revealed 2nd degree perineal lacetation. The laceration  was repaired with #2-0Vicryl suture in a normal fashion with local anesthesia.    At the end of the delivery mom and baby were recovering in stable condition.  Sponge, instrument, and needle counts were correct times two.   Annamarie Major, MD, [facility], [facility] 08/05/2021  2:25 AM

## 2020-12-06 ENCOUNTER — Ambulatory Visit: Payer: BC Managed Care – PPO | Admitting: Family Medicine

## 2020-12-09 ENCOUNTER — Encounter: Payer: Self-pay | Admitting: Family Medicine

## 2020-12-09 ENCOUNTER — Ambulatory Visit: Payer: BC Managed Care – PPO | Admitting: Family Medicine

## 2020-12-09 ENCOUNTER — Other Ambulatory Visit: Payer: Self-pay

## 2020-12-09 VITALS — BP 110/70 | HR 98 | Temp 97.1°F | Ht 61.0 in | Wt 106.0 lb

## 2020-12-09 DIAGNOSIS — Z3201 Encounter for pregnancy test, result positive: Secondary | ICD-10-CM | POA: Insufficient documentation

## 2020-12-09 DIAGNOSIS — N912 Amenorrhea, unspecified: Secondary | ICD-10-CM

## 2020-12-09 LAB — POCT URINE PREGNANCY: Preg Test, Ur: POSITIVE — AB

## 2020-12-09 NOTE — Progress Notes (Signed)
Subjective:    Patient ID: Alexis Bolton, female    DOB: May 03, 1996, 25 y.o.   MRN: [id number]  This visit occurred during the SARS-CoV-2 public health emergency.  Safety protocols were in place, including screening questions prior to the visit, additional usage of staff PPE, and extensive cleaning of exam room while observing appropriate contact time as indicated for disinfecting solutions.    HPI Pt presents for positive pregnancy test   Wt Readings from Last 3 Encounters:  12/09/20 106 lb (48.1 kg)  09/07/20 110 lb 2 oz (50 kg)  08/24/20 110 lb 8 oz (50.1 kg)   20.03 kg/m   Took 3 home preg tests all positive  12/25 and 12/26  Test here is positive   Feeling nauseated , not vomiting  Some cramping - vaginal, mild overall  Some fatigue also    Plans to carry the pregnancy  She is nervous and happy at the same time  This was unexpected   Bringing snacks to work  Not sensitive to smells   Caffeine- has cut back to one drink per day  Had some etoh before she found out - then stopped  No smoking or drugs   Taking allergy meds if px  Takes allergy shots  takes xyzal and singulair  Has not started a pnv   LMP 11/02/20  Puts her at 5 2/7 weeks  EDC would be 08/10/21   Pap and gyn exam done 9/21  Negative with neg HPV screen Also neg gc/chlam tests   Was taking Yaz OC- she felt depressed on it and stopped it   Had 2 HPV vaccines (second one in nov)   Results for orders placed or performed in visit on 12/09/20  POCT urine pregnancy  Result Value Ref Range   Preg Test, Ur Positive (A) Negative     Patient Active Problem List   Diagnosis Date Noted  . Positive pregnancy test 12/09/2020  . Neck pain 09/07/2020  . Nasal injury 09/07/2020  . Routine general medical examination at a health care facility 08/24/2020  . Visit for routine gyn exam 08/24/2020  . Screening examination for STD (sexually transmitted disease) 08/24/2020  . Dysmenorrhea 06/20/2018   . Allergic conjunctivitis 06/20/2018  . Large tonsils 06/20/2018  . Allergic reaction to food 07/31/2016  . Seasonal allergic rhinitis 07/31/2016  . Well adolescent visit 10/12/2015  . Exercise-induced asthma 09/26/2012   Past Medical History:  Diagnosis Date  . Eczema    History reviewed. No pertinent surgical history. Social History   Tobacco Use  . Smoking status: Never Smoker  . Smokeless tobacco: Never Used  Substance Use Topics  . Alcohol use: No    Alcohol/week: 0.0 standard drinks  . Drug use: No   History reviewed. No pertinent family history. Allergies  Allergen Reactions  . Other     Nuts, spirng mix causes itchy throat  . Pomegranate [[place]]     Itchy throat   Current Outpatient Medications on File Prior to Visit  Medication Sig Dispense Refill  . levocetirizine (XYZAL) 5 MG tablet Take 5 mg by mouth every evening.  0  . montelukast (SINGULAIR) 10 MG tablet Take 10 mg by mouth daily.  6  . olopatadine (PATANOL) 0.1 % ophthalmic solution Place 1 drop into both eyes 2 (two) times daily. (Patient taking differently: Place 1 drop into both eyes as needed.) 5 mL 0  . Pediatric Multiple Vit-C-FA (CHILDRENS MULTIVITAMIN PO) Take 1 tablet by mouth daily.    Marland Kitchen  triamcinolone cream (KENALOG) 0.1 % Apply 1 application topically 2 (two) times daily. Apply to AA. 30 g 0   No current facility-administered medications on file prior to visit.     Review of Systems  Constitutional: Positive for fatigue. Negative for activity change, appetite change, fever and unexpected weight change.  HENT: Negative for congestion, ear pain, rhinorrhea, sinus pressure and sore throat.   Eyes: Negative for pain, redness and visual disturbance.  Respiratory: Negative for cough, shortness of breath and wheezing.   Cardiovascular: Negative for chest pain and palpitations.  Gastrointestinal: Positive for nausea. Negative for abdominal pain, blood in stool, constipation, diarrhea and vomiting.   Endocrine: Negative for polydipsia and polyuria.  Genitourinary: Negative for dysuria, frequency, urgency and vaginal bleeding.  Musculoskeletal: Negative for arthralgias, back pain and myalgias.  Skin: Negative for pallor and rash.  Allergic/Immunologic: Negative for environmental allergies.  Neurological: Negative for dizziness, syncope and headaches.  Hematological: Negative for adenopathy. Does not bruise/bleed easily.  Psychiatric/Behavioral: Negative for decreased concentration and dysphoric mood. The patient is not nervous/anxious.        Objective:   Physical Exam Constitutional:      General: She is not in acute distress.    Appearance: Normal appearance. She is well-developed, normal weight and well-nourished. She is not ill-appearing.  HENT:     Head: Normocephalic and atraumatic.     Mouth/Throat:     Mouth: Oropharynx is clear and moist.  Eyes:     General: No scleral icterus.    Extraocular Movements: EOM normal.     Conjunctiva/sclera: Conjunctivae normal.     Pupils: Pupils are equal, round, and reactive to light.  Neck:     Thyroid: No thyromegaly.     Vascular: No carotid bruit or JVD.  Cardiovascular:     Rate and Rhythm: Regular rhythm. Tachycardia present.     Pulses: Normal pulses and intact distal pulses.     Heart sounds: Normal heart sounds. No gallop.   Pulmonary:     Effort: Pulmonary effort is normal. No respiratory distress.     Breath sounds: Normal breath sounds. No wheezing or rales.     Comments: No crackles Abdominal:     General: Bowel sounds are normal. There is no distension or abdominal bruit.     Palpations: Abdomen is soft. There is no mass.     Tenderness: There is no abdominal tenderness.     Comments: No suprapubic tenderness or fullness    Musculoskeletal:        General: No edema.     Cervical back: Normal range of motion and neck supple.  Lymphadenopathy:     Cervical: No cervical adenopathy.  Skin:    General: Skin is  warm and dry.     Coloration: Skin is not pale.     Findings: No rash.  Neurological:     Mental Status: She is alert.     Coordination: Coordination normal.     Deep Tendon Reflexes: Reflexes are normal and symmetric. Reflexes normal.  Psychiatric:        Mood and Affect: Mood and affect and mood normal.           Assessment & Plan:   Problem List Items Addressed This Visit      Other   Positive pregnancy test    Unplanned but pt is pleased  5 2/7 weeks by dates with LMP of 11/30 First pregnancy Boyfriend inv /monogamous relationship and live together Ref made  to obgyn Recommend what to expect book Fluids/small meals/exercise  Minimize caffeine  Stop current meds-she will talk to allergist pnv daily with food (will start with otc)  Disc exp re: nausea and cramping Will call for concerns or symptoms      Relevant Orders   Ambulatory referral to Obstetrics / Gynecology    Other Visit Diagnoses    Amenorrhea    -  Primary   Relevant Orders   POCT urine pregnancy (Completed)

## 2020-12-09 NOTE — Patient Instructions (Addendum)
No alcohol Stop medicines for now- see how you do and let allergist know   Start a prenatal vitamin  Over the counter  I have no brand preference   Get the book What to Expect when you are Expecting   Our office will call with the obgyn info  Call us if any concerns or new symptoms

## 2020-12-09 NOTE — Assessment & Plan Note (Addendum)
Unplanned but pt is pleased  5 2/7 weeks by dates with LMP of 11/30 First pregnancy Boyfriend inv /monogamous relationship and live together Ref made to obgyn Recommend what to expect book Fluids/small meals/exercise  Minimize caffeine  Stop current meds-she will talk to allergist pnv daily with food (will start with otc)  Disc exp re: nausea and cramping Will call for concerns or symptoms

## 2020-12-10 ENCOUNTER — Telehealth: Payer: Self-pay

## 2020-12-10 NOTE — Telephone Encounter (Signed)
Patient advised of all the information 

## 2020-12-10 NOTE — Telephone Encounter (Signed)
Left message for patient to call back in regards to the referral for OBGYN. Referral sent to [facility] in [place] and patient needs to call their office and schedule her appointment with them directly. Phone: [phone]

## 2021-01-21 LAB — OB RESULTS CONSOLE RUBELLA ANTIBODY, IGM: Rubella: IMMUNE

## 2021-01-21 LAB — OB RESULTS CONSOLE VARICELLA ZOSTER ANTIBODY, IGG: Varicella: NON-IMMUNE/NOT IMMUNE

## 2021-02-14 ENCOUNTER — Telehealth: Payer: Self-pay | Admitting: Family Medicine

## 2021-02-14 NOTE — Telephone Encounter (Signed)
What are her  symptoms?  

## 2021-02-14 NOTE — Telephone Encounter (Signed)
Letter printed.

## 2021-02-14 NOTE — Telephone Encounter (Signed)
Nose is "stopped up" she has been blowing nose so much it gave her a headache, pt had to d/c allergy med due to pregnancy. She sees the OB/GYN Friday and will see what allergy meds she can take but for now would like letter done for work

## 2021-02-14 NOTE — Telephone Encounter (Signed)
Pt notified letter ready for pick up. 

## 2021-02-14 NOTE — Telephone Encounter (Signed)
Patient is calling in asking for a note of excuse for her "allergies" she states that her being pregnant that she can't take her allergy medicine until Friday when she goes and sees her obgyn and see what she can take. She is asking for an excuse from work note. Please advise the patient. EM

## 2021-02-22 ENCOUNTER — Ambulatory Visit: Payer: BC Managed Care – PPO

## 2021-02-24 ENCOUNTER — Encounter: Payer: Self-pay | Admitting: Advanced Practice Midwife

## 2021-02-24 ENCOUNTER — Other Ambulatory Visit (HOSPITAL_COMMUNITY)
Admission: RE | Admit: 2021-02-24 | Discharge: 2021-02-24 | Disposition: A | Discharge status: Home / Self Care | Payer: BC Managed Care – PPO | Source: Ambulatory Visit | Attending: Advanced Practice Midwife | Admitting: Advanced Practice Midwife

## 2021-02-24 ENCOUNTER — Ambulatory Visit (INDEPENDENT_AMBULATORY_CARE_PROVIDER_SITE_OTHER): Payer: BC Managed Care – PPO | Admitting: Advanced Practice Midwife

## 2021-02-24 ENCOUNTER — Other Ambulatory Visit: Payer: Self-pay

## 2021-02-24 VITALS — Wt 113.0 lb

## 2021-02-24 DIAGNOSIS — Z3402 Encounter for supervision of normal first pregnancy, second trimester: Secondary | ICD-10-CM

## 2021-02-24 DIAGNOSIS — Z3400 Encounter for supervision of normal first pregnancy, unspecified trimester: Secondary | ICD-10-CM | POA: Insufficient documentation

## 2021-02-24 DIAGNOSIS — Z1379 Encounter for other screening for genetic and chromosomal anomalies: Secondary | ICD-10-CM

## 2021-02-24 DIAGNOSIS — Z113 Encounter for screening for infections with a predominantly sexual mode of transmission: Secondary | ICD-10-CM | POA: Diagnosis not present

## 2021-02-24 DIAGNOSIS — Z3A16 16 weeks gestation of pregnancy: Secondary | ICD-10-CM | POA: Diagnosis not present

## 2021-02-24 DIAGNOSIS — Z369 Encounter for antenatal screening, unspecified: Secondary | ICD-10-CM | POA: Diagnosis not present

## 2021-02-24 LAB — OB RESULTS CONSOLE GC/CHLAMYDIA: Gonorrhea: NEGATIVE

## 2021-02-24 NOTE — Patient Instructions (Signed)

## 2021-02-25 NOTE — Progress Notes (Signed)
New Obstetric Patient H&P  Transfer of care from [facility] (insurance not accepted) Date of Service: 02/24/2021  Chief Complaint: "Desires prenatal care"   History of Present Illness: Patient is a 25 y.o. G1P0 Not Hispanic or Latino female, presents with amenorrhea and positive home pregnancy test. Patient's last menstrual period was 11/02/2020 (exact date). and based on her  LMP, her EDD is Estimated Date of Delivery: 08/09/21 and her EGA is [redacted]w[redacted]d. Cycles are 4-5 days, regular, and occur approximately every : 28 days. Her last pap smear was 1 years ago and was no abnormalities.    She had a urine pregnancy test which was positive 3 month(s)  ago. Her last menstrual period was normal and lasted for  4 or 5 day(s). Since her LMP she claims she has experienced fatigue, nausea. She denies vaginal bleeding. Her past medical history is noncontributory. This is her first pregnancy.  Since her LMP, she admits to the use of tobacco products  She quit with positive UPT She claims she has gained 13 pounds since the start of her pregnancy.  There are cats in the home in the home  yes If yes Indoor partner takes care of litterbox She admits close contact with children on a regular basis  no  She has had chicken pox in the past no She has had Tuberculosis exposures, symptoms, or previously tested positive for TB   no Current or past history of domestic violence. no  Genetic Screening/Teratology Counseling: (Includes patient, baby's father, or anyone in either family with:)   1. Patient's age >/= 96 at [facility]  no 2. Thalassemia ([place], [place], Mediterranean, or Asian background): MCV<80  no 3. Neural tube defect (meningomyelocele, spina bifida, anencephaly)  no 4. Congenital heart defect  no  5. Down syndrome  no 6. Tay-Sachs (Jewish, [place]  no 7. Canavan's Disease  no 8. Sickle cell disease or trait (African)  no  9. Hemophilia or other blood disorders  no  10. Muscular dystrophy  no   11. Cystic fibrosis  no  12. Huntington's Chorea  no  13. Mental retardation/autism  no 14. Other inherited genetic or chromosomal disorder  no 15. Maternal metabolic disorder (DM, PKU, etc)  no 16. Patient or FOB with a child with a birth defect not listed above no  16a. Patient or FOB with a birth defect themselves no 17. Recurrent pregnancy loss, or stillbirth  no  18. Any medications since LMP other than prenatal vitamins (include vitamins, supplements, OTC meds, drugs, alcohol)  no 19. Any other genetic/environmental exposure to discuss  no  Infection History:   1. Lives with someone with TB or TB exposed  no  2. Patient or partner has history of genital herpes  no 3. Rash or viral illness since LMP  no 4. History of STI (GC, CT, HPV, syphilis, HIV)  no 5. History of recent travel :  no  Other pertinent information:  no    Review of Systems:10 point review of systems negative unless otherwise noted in HPI  Past Medical History:  Patient Active Problem List   Diagnosis Date Noted  . Supervision of normal first pregnancy 02/24/2021    Clinic [facility] Prenatal Labs  Dating LMP c/w 11w Blood type:     Genetic Screen 1 Screen:    AFP:     Quad:     NIPS: Antibody:   Anatomic [place]  Rubella:    Varicella: @VZVIGG @  GTT Early: NA  Third trimester:  RPR: NON-REACTIVE (09/21 0912)   Rhogam  HBsAg:     Vaccines TDAP:                       Flu Shot: Covid: HIV: NON-REACTIVE (09/21 0912)   Baby Food Breast                               GBS:   GC/CT:   Contraception  Pap: 2021 negative  CBB     CS/VBAC NA   Support Person Adam       . Positive pregnancy test 12/09/2020  . Seasonal allergic rhinitis 07/31/2016    Spring and fall    . Exercise-induced asthma 09/26/2012    Past Surgical History:  History reviewed. No pertinent surgical history.  Gynecologic History: Patient's last menstrual period was 11/02/2020 (exact date).  Obstetric History:  G1P0  Family History:  History reviewed. No pertinent family history.  Social History:  Social History   Socioeconomic History  . Marital status: Single    Spouse name: Not on file  . Number of children: Not on file  . Years of education: Not on file  . Highest education level: Not on file  Occupational History  . Not on file  Tobacco Use  . Smoking status: Never Smoker  . Smokeless tobacco: Never Used  Substance and Sexual Activity  . Alcohol use: No    Alcohol/week: 0.0 standard drinks  . Drug use: No  . Sexual activity: Not on file  Other Topics Concern  . Not on file  Social History Narrative   Lives with mom, dad and sister   Social Determinants of Health   Financial Resource Strain: Not on file  Food Insecurity: Not on file  Transportation Needs: Not on file  Physical Activity: Not on file  Stress: Not on file  Social Connections: Not on file  Intimate Partner Violence: Not on file    Allergies:  Allergies  Allergen Reactions  . Other     Nuts, spirng mix causes itchy throat  . Pomegranate [[place]]     Itchy throat    Medications: Prior to Admission medications   Medication Sig Start Date End Date Taking? Authorizing Provider  EPINEPHrine 0.3 mg/0.3 mL IJ SOAJ injection See admin instructions.    [provider]  fluticasone (FLONASE) 50 MCG/ACT nasal spray 1-2 SPRAY IN EACH NOSTRIL ONCE A DAY NASALLY 90    [provider]    Physical Exam Vitals: Weight 113 lb (51.3 kg), last menstrual period 11/02/2020.  General: NAD HEENT: normocephalic, anicteric Thyroid: no enlargement, no palpable nodules Pulmonary: No increased work of breathing, CTAB Cardiovascular: RRR, distal pulses 2+ Abdomen: NABS, soft, non-tender, non-distended.  Umbilicus without lesions.  No hepatomegaly, splenomegaly or masses palpable. No evidence of hernia  Genitourinary:  External: Normal external female genitalia.  Normal urethral meatus, normal Bartholin's  and Skene's glands.    Vagina: Normal vaginal mucosa, no evidence of prolapse.    Cervix: Grossly normal in appearance, no bleeding, no CMT  Uterus: Enlarged, mobile, normal contour.    Adnexa: deferred  Rectal: deferred Extremities: no edema, erythema, or tenderness Neurologic: Grossly intact Psychiatric: mood appropriate, affect full   The following were addressed during this visit:  Breastfeeding Education - Early initiation of breastfeeding    Comments: Keeps milk supply adequate, helps contract uterus and slow bleeding, and early milk is the  perfect first food and is easy to digest.   - The importance of exclusive breastfeeding    Comments: Provides antibodies, Lower risk of breast and ovarian cancers, and type-2 diabetes,Helps your body recover, Reduced chance of SIDS.   - Risks of giving your baby anything other than breast milk if you are breastfeeding    Comments: Make the baby less content with breastfeeds, may make my baby more susceptible to illness, and may reduce my milk supply.   - The importance of early skin-to-skin contact    Comments: Keeps baby warm and secure, helps keep baby's blood sugar up and breathing steady, easier to bond and breastfeed, and helps calm baby.  - Rooming-in on a 24-hour basis    Comments: Easier to learn baby's feeding cues, easier to bond and get to know each other, and encourages milk production.   - Feeding on demand or baby-led feeding    Comments: Helps prevent breastfeeding complications, helps bring in good milk supply, prevents under or overfeeding, and helps baby feel content and satisfied   - Frequent feeding to help assure optimal milk production    Comments: Making a full supply of milk requires frequent removal of milk from breasts, infant will eat 8-12 times in 24 hours, if separated from infant use breast massage, hand expression and/ or pumping to remove milk from breasts.   - Effective positioning and attachment     Comments: Helps my baby to get enough breast milk, helps to produce an adequate milk supply, and helps prevent nipple pain and damage   - Exclusive breastfeeding for the first 6 months    Comments: Builds a healthy milk supply and keeps it up, protects baby from sickness and disease, and breastmilk has everything your baby needs for the first 6 months.  Ultrasound done at [facility]: 2/18 [redacted]w[redacted]d, EDD not adjusted for 2 day difference with LMP  Assessment: 25 y.o. G1P0 at [redacted]w[redacted]d by LMP presenting to initiate prenatal care  Plan: 1) Avoid alcoholic beverages. 2) Patient encouraged not to smoke.  3) Discontinue the use of all non-medicinal drugs and chemicals.  4) Take prenatal vitamins daily.  5) Nutrition, food safety (fish, cheese advisories, and high nitrite foods) and exercise discussed. 6) Hospital and practice style discussed with cross coverage system.  7) Genetic Screening, such as with 1st Trimester Screening, cell free fetal DNA, AFP testing, and Ultrasound, as well as with amniocentesis and CVS as appropriate, is discussed with patient. At the conclusion of today's visit patient requested cell free DNA genetic testing 8) Patient is asked about travel to areas at risk for the Zika virus, and counseled to avoid travel and exposure to mosquitoes or sexual partners who may have themselves been exposed to the virus. Testing is discussed, and will be ordered as appropriate.  9) Aptima, urine culture, NOB panel, MaterniT 21 today (ancillary lab draw) 10) Return to clinic in 4 weeks for ROB 11) Anatomy scan [facility]) in 4 weeks   Tresea Mall, CNM [facility] 02/25/2021, 11:14 AM

## 2021-02-28 LAB — URINE CULTURE

## 2021-02-28 LAB — CERVICOVAGINAL ANCILLARY ONLY
Chlamydia: NEGATIVE
Comment: NEGATIVE
Comment: NEGATIVE
Comment: NORMAL
Neisseria Gonorrhea: NEGATIVE
Trichomonas: NEGATIVE

## 2021-03-21 ENCOUNTER — Other Ambulatory Visit: Payer: Self-pay

## 2021-03-21 ENCOUNTER — Other Ambulatory Visit: Payer: Self-pay | Admitting: Advanced Practice Midwife

## 2021-03-21 ENCOUNTER — Other Ambulatory Visit: Payer: BC Managed Care – PPO

## 2021-03-21 DIAGNOSIS — Z3402 Encounter for supervision of normal first pregnancy, second trimester: Secondary | ICD-10-CM

## 2021-03-21 DIAGNOSIS — Z1379 Encounter for other screening for genetic and chromosomal anomalies: Secondary | ICD-10-CM

## 2021-03-21 DIAGNOSIS — Z3A16 16 weeks gestation of pregnancy: Secondary | ICD-10-CM

## 2021-03-21 DIAGNOSIS — Z369 Encounter for antenatal screening, unspecified: Secondary | ICD-10-CM

## 2021-03-31 ENCOUNTER — Other Ambulatory Visit: Payer: Self-pay

## 2021-03-31 ENCOUNTER — Ambulatory Visit
Admission: RE | Admit: 2021-03-31 | Discharge: 2021-03-31 | Disposition: A | Discharge status: Home / Self Care | Payer: BC Managed Care – PPO | Source: Ambulatory Visit | Attending: Advanced Practice Midwife | Admitting: Advanced Practice Midwife

## 2021-03-31 DIAGNOSIS — Z369 Encounter for antenatal screening, unspecified: Secondary | ICD-10-CM | POA: Insufficient documentation

## 2021-03-31 DIAGNOSIS — Z3402 Encounter for supervision of normal first pregnancy, second trimester: Secondary | ICD-10-CM | POA: Diagnosis not present

## 2021-04-01 ENCOUNTER — Ambulatory Visit (INDEPENDENT_AMBULATORY_CARE_PROVIDER_SITE_OTHER): Payer: BC Managed Care – PPO | Admitting: Advanced Practice Midwife

## 2021-04-01 ENCOUNTER — Encounter: Payer: Self-pay | Admitting: Advanced Practice Midwife

## 2021-04-01 VITALS — BP 110/60 | Wt 116.0 lb

## 2021-04-01 DIAGNOSIS — Z3402 Encounter for supervision of normal first pregnancy, second trimester: Secondary | ICD-10-CM

## 2021-04-01 DIAGNOSIS — Z3A21 21 weeks gestation of pregnancy: Secondary | ICD-10-CM

## 2021-04-01 LAB — POCT URINALYSIS DIPSTICK OB
Glucose, UA: NEGATIVE
POC,PROTEIN,UA: NEGATIVE

## 2021-04-01 NOTE — Progress Notes (Signed)
  Routine Prenatal Care Visit  Subjective  Alexis Bolton is a 25 y.o. G1P0 at [redacted]w[redacted]d being seen today for ongoing prenatal care.  She is currently monitored for the following issues for this low-risk pregnancy and has Exercise-induced asthma; Seasonal allergic rhinitis; Positive pregnancy test; and Supervision of normal first pregnancy on their problem list.  ----------------------------------------------------------------------------------- Patient reports no complaints.   Contractions: Not present. Vag. Bleeding: None.  Movement: Present. Leaking Fluid denies.  ----------------------------------------------------------------------------------- The following portions of the patient's history were reviewed and updated as appropriate: allergies, current medications, past family history, past medical history, past social history, past surgical history and problem list. Problem list updated.  Objective  Blood pressure 110/60, weight 116 lb (52.6 kg), last menstrual period 11/02/2020. Pregravid weight 100 lb (45.4 kg) Total Weight Gain 16 lb (7.258 kg) Urinalysis: Urine Protein    Urine Glucose    Fetal Status: Fetal Heart Rate (bpm): 147 Fundal Height: 21 cm Movement: Present      Anatomy ultrasound at [facility]: complete, normal, female, transverse, placenta anterior  General:  Alert, oriented and cooperative. Patient is in no acute distress.  Skin: Skin is warm and dry. No rash noted.   Cardiovascular: Normal heart rate noted  Respiratory: Normal respiratory effort, no problems with respiration noted  Abdomen: Soft, gravid, appropriate for gestational age. Pain/Pressure: Absent     Pelvic:  Cervical exam deferred        Extremities: Normal range of motion.     Mental Status: Normal mood and affect. Normal behavior. Normal judgment and thought content.   Assessment   25 y.o. G1P0 at [redacted]w[redacted]d by  08/09/2021, by Last Menstrual Period presenting for routine prenatal visit  Plan   [email or web address]   Problems (from 11/02/20 to present)    Problem Noted Resolved   Supervision of normal first pregnancy 02/24/2021 by Tresea Mall, CNM No   Overview Signed 02/25/2021 10:34 AM by Tresea Mall, CNM    Clinic [facility] Prenatal Labs  Dating LMP c/w 11w Blood type: A+  Genetic Screen 1 Screen:    AFP:     Quad:     NIPS: Antibody: negative  Anatomic [place] Complete 4/28 Rubella: immune   Varicella: not immune  GTT Early: NA                Third trimester:  RPR: NON-REACTIVE (09/21 0912)   Rhogam  HBsAg:     Vaccines TDAP:                       Flu Shot: Covid: HIV: NON-REACTIVE (09/21 0912)   Baby Food Breast                               GBS:   GC/CT:   Contraception  Pap: 2021 negative  CBB     CS/VBAC NA   Support Person Adam              Preterm labor symptoms and general obstetric precautions including but not limited to vaginal bleeding, contractions, leaking of fluid and fetal movement were reviewed in detail with the patient.    Return in about 4 weeks (around 04/29/2021) for rob.  Tresea Mall, CNM 04/01/2021 11:02 AM

## 2021-04-01 NOTE — Progress Notes (Signed)
ROB- Anatomy U/S follow up, no concerns

## 2021-04-12 ENCOUNTER — Other Ambulatory Visit: Payer: Self-pay

## 2021-04-12 ENCOUNTER — Encounter: Payer: Self-pay | Admitting: Primary Care

## 2021-04-12 ENCOUNTER — Ambulatory Visit (INDEPENDENT_AMBULATORY_CARE_PROVIDER_SITE_OTHER): Payer: BC Managed Care – PPO | Admitting: Primary Care

## 2021-04-12 DIAGNOSIS — M25561 Pain in right knee: Secondary | ICD-10-CM

## 2021-04-12 DIAGNOSIS — M25569 Pain in unspecified knee: Secondary | ICD-10-CM | POA: Insufficient documentation

## 2021-04-12 NOTE — Progress Notes (Signed)
Subjective:    Patient ID: Alexis Bolton, female    DOB: 07-Jul-1996, 25 y.o.   MRN: [id number]  HPI  Alexis Bolton is a very pleasant 25 y.o. female patient of Dr. Milinda Antis who presents today to discuss knee pain.  Her pain is located to the right bilateral anterior knee which began four days ago while at work. She endorses this type of pain to the right knee in the past which typically resolves the following today. In this case her pain has not abated and has slowly increased with some difficulty walking yesterday and today.   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 She's been off work today but hasn't noticed improvement. She has noticed some weakness and tingling to the sites of her pain, some radiation of pain down her ankle.   She denies trauma/injury, swelling, erythema, warmth. She's been a Production assistant, radio at [employer] for the last three years. She's been applying an ice pack and massage with some improvement.  She plans on purchasing a knee brace.   She is pregnant.    Review of Systems  Musculoskeletal: Positive for arthralgias. Negative for joint swelling.  Skin: Negative for color change.         Past Medical History:  Diagnosis Date  . Eczema     Social History   Socioeconomic History  . Marital status: Single    Spouse name: Not on file  . Number of children: Not on file  . Years of education: Not on file  . Highest education level: Not on file  Occupational History  . Not on file  Tobacco Use  . Smoking status: Never Smoker  . Smokeless tobacco: Never Used  Substance and Sexual Activity  . Alcohol use: No    Alcohol/week: 0.0 standard drinks  . Drug use: No  . Sexual activity: Not on file  Other Topics Concern  . Not on file  Social History Narrative   Lives with mom, dad and sister   Social Determinants of Health   Financial Resource Strain: Not on file  Food Insecurity: Not on file  Transportation Needs: Not on  file  Physical Activity: Not on file  Stress: Not on file  Social Connections: Not on file  Intimate Partner Violence: Not on file    History reviewed. No pertinent surgical history.  History reviewed. No pertinent family history.  Allergies  Allergen Reactions  . Other     Nuts, spring mix causes itchy throat  . Pomegranate [Punica]     Itchy throat    Current Outpatient Medications on File Prior to Visit  Medication Sig Dispense Refill  . EPINEPHrine 0.3 mg/0.3 mL IJ SOAJ injection See admin instructions.    . fluticasone (FLONASE) 50 MCG/ACT nasal spray 1-2 SPRAY IN EACH NOSTRIL ONCE A DAY NASALLY 90    . levocetirizine (XYZAL) 5 MG tablet SMARTSIG:1 Tablet(s) By Mouth Every Evening    . montelukast (SINGULAIR) 10 MG tablet Take 1 tablet by mouth at bedtime.     No current facility-administered medications on file prior to visit.    BP 120/62   Pulse (!) 101   Temp 98 F (36.7 C) (Temporal)   Ht 5\' 1"  (1.549 m)   Wt 118 lb (53.5 kg)   LMP 11/02/2020 (Exact Date)   SpO2 98%   BMI 22.30 kg/m  Objective:   Physical Exam Musculoskeletal:     Right knee: No  swelling or bony tenderness. Decreased range of motion. Normal pulse.     Instability Tests: Anterior drawer test negative. Anterior Lachman test negative.     Left knee: No swelling or bony tenderness. Normal range of motion.     Comments: Slight decrease in ROM with right knee extension.  Neurological:     Mental Status: She is alert.           Assessment & Plan:      This visit occurred during the SARS-CoV-2 public health emergency.  Safety protocols were in place, including screening questions prior to the visit, additional usage of staff PPE, and extensive cleaning of exam room while observing appropriate contact time as indicated for disinfecting solutions.

## 2021-04-12 NOTE — Patient Instructions (Addendum)
Purchase a knee sleeve/brace to wear while at work.  You can take Tylenol 500 mg every 8 hours as needed for pain.   You can also try diclofenac (Voltaren) gel to the knee three times daily.  Elevate your leg when resting.  Please call us back if no improvement within a week.  It was a pleasure meeting you!

## 2021-04-12 NOTE — Assessment & Plan Note (Signed)
Acute for the last four days, no improvement with rest.  Today exam doesn't reveal obvious laxity, bursitis, or dislocation. Could be tendonitis, strain.  Discussed to purchase knee sleeve for support. Also discussed use of Tylenol and Diclofenac Gel. Elevate and ice the knee when resting.  Offered physical therapy for which she kindly declines, but she will update if she changes her mind.  Work note provided.

## 2021-04-29 ENCOUNTER — Ambulatory Visit (INDEPENDENT_AMBULATORY_CARE_PROVIDER_SITE_OTHER): Payer: BC Managed Care – PPO | Admitting: Obstetrics and Gynecology

## 2021-04-29 ENCOUNTER — Other Ambulatory Visit: Payer: Self-pay

## 2021-04-29 ENCOUNTER — Encounter: Payer: Self-pay | Admitting: Obstetrics and Gynecology

## 2021-04-29 VITALS — BP 110/60 | Wt 123.0 lb

## 2021-04-29 DIAGNOSIS — Z3402 Encounter for supervision of normal first pregnancy, second trimester: Secondary | ICD-10-CM

## 2021-04-29 DIAGNOSIS — Z131 Encounter for screening for diabetes mellitus: Secondary | ICD-10-CM

## 2021-04-29 DIAGNOSIS — Z3A25 25 weeks gestation of pregnancy: Secondary | ICD-10-CM

## 2021-04-29 DIAGNOSIS — Z113 Encounter for screening for infections with a predominantly sexual mode of transmission: Secondary | ICD-10-CM

## 2021-04-29 LAB — POCT URINALYSIS DIPSTICK OB
Glucose, UA: NEGATIVE
POC,PROTEIN,UA: NEGATIVE

## 2021-04-29 NOTE — Progress Notes (Signed)
  Routine Prenatal Care Visit  Subjective  Alexis Bolton is a 25 y.o. G1P0 at [redacted]w[redacted]d being seen today for ongoing prenatal care.  She is currently monitored for the following issues for this low-risk pregnancy and has Exercise-induced asthma; Seasonal allergic rhinitis; Positive pregnancy test; Supervision of normal first pregnancy; and Acute knee pain on their problem list.  ----------------------------------------------------------------------------------- Patient reports no complaints.   Contractions: Not present. Vag. Bleeding: None.  Movement: Present. Leaking Fluid denies.  ----------------------------------------------------------------------------------- The following portions of the patient's history were reviewed and updated as appropriate: allergies, current medications, past family history, past medical history, past social history, past surgical history and problem list. Problem list updated.  Objective  Blood pressure 110/60, weight 123 lb (55.8 kg), last menstrual period 11/02/2020. Pregravid weight 100 lb (45.4 kg) Total Weight Gain 23 lb (10.4 kg) Urinalysis: Urine Protein    Urine Glucose    Fetal Status: Fetal Heart Rate (bpm): 155 Fundal Height: 25 cm Movement: Present     General:  Alert, oriented and cooperative. Patient is in no acute distress.  Skin: Skin is warm and dry. No rash noted.   Cardiovascular: Normal heart rate noted  Respiratory: Normal respiratory effort, no problems with respiration noted  Abdomen: Soft, gravid, appropriate for gestational age. Pain/Pressure: Absent     Pelvic:  Cervical exam deferred        Extremities: Normal range of motion.     Mental Status: Normal mood and affect. Normal behavior. Normal judgment and thought content.   Assessment   25 y.o. G1P0 at [redacted]w[redacted]d by  08/09/2021, by Last Menstrual Period presenting for routine prenatal visit  Plan   [email or web address]  Problems (from 11/02/20 to present)    Problem Noted Resolved    Supervision of normal first pregnancy 02/24/2021 by Tresea Mall, CNM No   Overview Addendum 04/29/2021  9:17 AM by Conard Novak, MD    Clinic [facility] Prenatal Labs  Dating LMP c/w 11w Blood type: A+   Genetic Screen NIPS: diploid XX Antibody: negative  Anatomic [place] Complete 4/28 Rubella: not immune  Varicella: not immune  GTT Early: NA                Third trimester:  RPR: NON-REACTIVE (09/21 0912)   Rhogam  HBsAg:     Vaccines TDAP:                       Flu Shot: Covid: HIV: NON-REACTIVE (09/21 0912)   Baby Food Breast                               GBS:   GC/CT:   Contraception  Pap: 2021 negative  CBB     CS/VBAC NA   Support Person Madelaine Bhat          Previous Version       Preterm labor symptoms and general obstetric precautions including but not limited to vaginal bleeding, contractions, leaking of fluid and fetal movement were reviewed in detail with the patient. Please refer to After Visit Summary for other counseling recommendations.   Return in about 3 weeks (around 05/20/2021) for 28 wk labs and ROB.   Thomasene Mohair, MD, [facility], [facility] 04/29/2021 9:34 AM

## 2021-04-29 NOTE — Addendum Note (Signed)
Addended by: Donnetta Hail on: 04/29/2021 09:44 AM   Modules accepted: Orders

## 2021-05-19 ENCOUNTER — Other Ambulatory Visit: Payer: Self-pay

## 2021-05-19 ENCOUNTER — Ambulatory Visit (INDEPENDENT_AMBULATORY_CARE_PROVIDER_SITE_OTHER): Payer: BC Managed Care – PPO | Admitting: Obstetrics and Gynecology

## 2021-05-19 ENCOUNTER — Other Ambulatory Visit: Payer: BC Managed Care – PPO

## 2021-05-19 ENCOUNTER — Encounter: Payer: Self-pay | Admitting: Obstetrics and Gynecology

## 2021-05-19 VITALS — BP 106/70 | Ht 61.0 in | Wt 126.4 lb

## 2021-05-19 DIAGNOSIS — Z113 Encounter for screening for infections with a predominantly sexual mode of transmission: Secondary | ICD-10-CM

## 2021-05-19 DIAGNOSIS — Z3A28 28 weeks gestation of pregnancy: Secondary | ICD-10-CM

## 2021-05-19 DIAGNOSIS — Z3402 Encounter for supervision of normal first pregnancy, second trimester: Secondary | ICD-10-CM

## 2021-05-19 DIAGNOSIS — Z131 Encounter for screening for diabetes mellitus: Secondary | ICD-10-CM

## 2021-05-19 DIAGNOSIS — O26843 Uterine size-date discrepancy, third trimester: Secondary | ICD-10-CM

## 2021-05-19 DIAGNOSIS — Z3403 Encounter for supervision of normal first pregnancy, third trimester: Secondary | ICD-10-CM

## 2021-05-19 NOTE — Patient Instructions (Signed)

## 2021-05-19 NOTE — Progress Notes (Signed)
    Routine Prenatal Care Visit  Subjective  Sidrah A Abdon is a 25 y.o. G1P0 at [redacted]w[redacted]d being seen today for ongoing prenatal care.  She is currently monitored for the following issues for this low-risk pregnancy and has Exercise-induced asthma; Seasonal allergic rhinitis; Positive pregnancy test; Supervision of normal first pregnancy; and Acute knee pain on their problem list.  ----------------------------------------------------------------------------------- Patient reports no complaints.   Contractions: Not present. Vag. Bleeding: None.  Movement: Present. Denies leaking of fluid.  ----------------------------------------------------------------------------------- The following portions of the patient's history were reviewed and updated as appropriate: allergies, current medications, past family history, past medical history, past social history, past surgical history and problem list. Problem list updated.   Objective  Blood pressure 106/70, height 5\' 1"  (1.549 m), weight 126 lb 6.4 oz (57.3 kg), last menstrual period 11/02/2020. Pregravid weight 100 lb (45.4 kg) Total Weight Gain 26 lb 6.4 oz (12 kg) Urinalysis:      Fetal Status: Fetal Heart Rate (bpm): 140 Fundal Height: 24 cm Movement: Present     General:  Alert, oriented and cooperative. Patient is in no acute distress.  Skin: Skin is warm and dry. No rash noted.   Cardiovascular: Normal heart rate noted  Respiratory: Normal respiratory effort, no problems with respiration noted  Abdomen: Soft, gravid, appropriate for gestational age. Pain/Pressure: Absent     Pelvic:  Cervical exam deferred        Extremities: Normal range of motion.  Edema: None  Mental Status: Normal mood and affect. Normal behavior. Normal judgment and thought content.     Assessment   25 y.o. G1P0 at [redacted]w[redacted]d by  08/09/2021, by Last Menstrual Period presenting for routine prenatal visit  Plan   [email or web address]  Problems (from 11/02/20 to present)      Problem Noted Resolved   Supervision of normal first pregnancy 02/24/2021 by 02/26/2021, CNM No   Overview Addendum 05/19/2021 10:40 AM by 05/21/2021, MD     Nursing Staff Provider  Office Location  [facility] Dating   LMP = 11 wk Natale Milch  Language  English Anatomy US  Complete  Flu Vaccine   Genetic Screen  NIPS: Normal XX  TDaP vaccine    Hgb A1C or  GTT Early : Third trimester :   Covid unvaccinated   LAB RESULTS   Rhogam  Not needed Blood Type   A+  Feeding Plan Breast Antibody  negative  Contraception pill Rubella   NI  Circumcision  RPR NON-REACTIVE (09/21 0912)   Pediatrician   HBsAg   nonreactive  Support Person  Adam HIV NON-REACTIVE (09/21 03-13-1986)  Prenatal Classes Discussed Varicella NI    GBS  (For PCN allergy, check sensitivities)   BTL Consent     VBAC Consent  Pap  2021 NIL    Hgb Electro      CF      SMA                   Size date discrepancy- growth 2022 ordered 28 week labs today  Gestational age appropriate obstetric precautions including but not limited to vaginal bleeding, contractions, leaking of fluid and fetal movement were reviewed in detail with the patient.    Return in about 2 weeks (around 06/02/2021) for ROB in person- growth 06/04/2021 ordered.  [place] MD [facility], [facility] 05/19/2021, 10:42 AM

## 2021-05-20 LAB — 28 WEEK RH+PANEL
Basophils Absolute: 0 10*3/uL (ref 0.0–0.2)
Basos: 1 %
EOS (ABSOLUTE): 0.1 10*3/uL (ref 0.0–0.4)
Eos: 1 %
Gestational Diabetes Screen: 123 mg/dL (ref 65–139)
HIV Screen 4th Generation wRfx: NONREACTIVE
Hematocrit: 30.6 % — ABNORMAL LOW (ref 34.0–46.6)
Hemoglobin: 10.2 g/dL — ABNORMAL LOW (ref 11.1–15.9)
Immature Grans (Abs): 0.1 10*3/uL (ref 0.0–0.1)
Immature Granulocytes: 1 %
Lymphocytes Absolute: 1.4 10*3/uL (ref 0.7–3.1)
Lymphs: 21 %
MCH: 30.4 pg (ref 26.6–33.0)
MCHC: 33.3 g/dL (ref 31.5–35.7)
MCV: 91 fL (ref 79–97)
Monocytes Absolute: 0.7 10*3/uL (ref 0.1–0.9)
Monocytes: 10 %
Neutrophils Absolute: 4.5 10*3/uL (ref 1.4–7.0)
Neutrophils: 66 %
Platelets: 257 10*3/uL (ref 150–450)
RBC: 3.35 x10E6/uL — ABNORMAL LOW (ref 3.77–5.28)
RDW: 12.9 % (ref 11.7–15.4)
RPR Ser Ql: NONREACTIVE
WBC: 6.7 10*3/uL (ref 3.4–10.8)

## 2021-05-20 LAB — HEPATITIS B SURFACE ANTIGEN: Hepatitis B Surface Ag: NEGATIVE

## 2021-05-30 ENCOUNTER — Other Ambulatory Visit: Payer: Self-pay

## 2021-05-30 ENCOUNTER — Ambulatory Visit
Admission: RE | Admit: 2021-05-30 | Discharge: 2021-05-30 | Disposition: A | Discharge status: Home / Self Care | Payer: BC Managed Care – PPO | Source: Ambulatory Visit | Attending: Obstetrics and Gynecology | Admitting: Obstetrics and Gynecology

## 2021-05-30 DIAGNOSIS — O26843 Uterine size-date discrepancy, third trimester: Secondary | ICD-10-CM | POA: Insufficient documentation

## 2021-06-02 ENCOUNTER — Encounter: Payer: Self-pay | Admitting: Advanced Practice Midwife

## 2021-06-02 ENCOUNTER — Other Ambulatory Visit: Payer: Self-pay

## 2021-06-02 ENCOUNTER — Ambulatory Visit (INDEPENDENT_AMBULATORY_CARE_PROVIDER_SITE_OTHER): Payer: BC Managed Care – PPO | Admitting: Advanced Practice Midwife

## 2021-06-02 VITALS — BP 104/70 | Wt 129.0 lb

## 2021-06-02 DIAGNOSIS — Z3403 Encounter for supervision of normal first pregnancy, third trimester: Secondary | ICD-10-CM

## 2021-06-02 DIAGNOSIS — Z3A30 30 weeks gestation of pregnancy: Secondary | ICD-10-CM

## 2021-06-02 DIAGNOSIS — Z23 Encounter for immunization: Secondary | ICD-10-CM | POA: Diagnosis not present

## 2021-06-02 LAB — POCT URINALYSIS DIPSTICK OB
Glucose, UA: NEGATIVE
POC,PROTEIN,UA: NEGATIVE

## 2021-06-02 NOTE — Progress Notes (Signed)
ROB- TDAP/BT consent signed today, no concerns

## 2021-06-02 NOTE — Addendum Note (Signed)
Addended by: Donnetta Hail on: 06/02/2021 02:48 PM   Modules accepted: Orders

## 2021-06-02 NOTE — Progress Notes (Signed)
  Routine Prenatal Care Visit  Subjective  Alexis Bolton is a 25 y.o. G1P0 at [redacted]w[redacted]d being seen today for ongoing prenatal care.  She is currently monitored for the following issues for this low-risk pregnancy and has Exercise-induced asthma; Seasonal allergic rhinitis; Positive pregnancy test; Supervision of normal first pregnancy; and Acute knee pain on their problem list.  ----------------------------------------------------------------------------------- Patient reports no complaints.   Contractions: Not present. Vag. Bleeding: None.  Movement: Present. Leaking Fluid denies.  ----------------------------------------------------------------------------------- The following portions of the patient's history were reviewed and updated as appropriate: allergies, current medications, past family history, past medical history, past social history, past surgical history and problem list. Problem list updated.  Objective  Blood pressure 104/70, weight 129 lb (58.5 kg), last menstrual period 11/02/2020. Pregravid weight 100 lb (45.4 kg) Total Weight Gain 29 lb (13.2 kg) Urinalysis: Urine Protein    Urine Glucose    Fetal Status: Fetal Heart Rate (bpm): 158 Fundal Height: 30 cm Movement: Present     General:  Alert, oriented and cooperative. Patient is in no acute distress.  Skin: Skin is warm and dry. No rash noted.   Cardiovascular: Normal heart rate noted  Respiratory: Normal respiratory effort, no problems with respiration noted  Abdomen: Soft, gravid, appropriate for gestational age. Pain/Pressure: Absent     Pelvic:  Cervical exam deferred        Extremities: Normal range of motion.  Edema: None  Mental Status: Normal mood and affect. Normal behavior. Normal judgment and thought content.   Assessment   25 y.o. G1P0 at [redacted]w[redacted]d by  08/09/2021, by Last Menstrual Period presenting for routine prenatal visit  Plan   [email or web address]  Problems (from 11/02/20 to present)    Problem Noted Resolved    Supervision of normal first pregnancy 02/24/2021 by Tresea Mall, CNM No   Overview Addendum 05/19/2021 10:40 AM by Natale Milch, MD     Nursing Staff Provider  Office Location  [facility] Dating   LMP = 11 wk [place]  Language  English Anatomy US  Complete  Flu Vaccine   Genetic Screen  NIPS: Normal XX  TDaP vaccine   06/02/21 Hgb A1C or  GTT Early : Third trimester :   Covid unvaccinated   LAB RESULTS   Rhogam  Not needed Blood Type   A+  Feeding Plan Breast Antibody  negative  Contraception pill Rubella   NI  Circumcision  RPR NON-REACTIVE (09/21 0912)   Pediatrician   HBsAg   nonreactive  Support Person  Adam HIV NON-REACTIVE (09/21 1914)  Prenatal Classes Discussed Varicella NI    GBS  (For PCN allergy, check sensitivities)   BTL Consent     VBAC Consent  Pap  2021 NIL    Hgb Electro      CF      SMA                    Preterm labor symptoms and general obstetric precautions including but not limited to vaginal bleeding, contractions, leaking of fluid and fetal movement were reviewed in detail with the patient.   Return in about 2 weeks (around 06/16/2021) for rob.  Tresea Mall, CNM 06/02/2021 2:30 PM

## 2021-06-12 ENCOUNTER — Encounter: Payer: Self-pay | Admitting: Obstetrics and Gynecology

## 2021-06-12 ENCOUNTER — Emergency Department: Payer: BC Managed Care – PPO

## 2021-06-12 ENCOUNTER — Other Ambulatory Visit: Payer: Self-pay

## 2021-06-12 ENCOUNTER — Observation Stay
Admission: EM | Admit: 2021-06-12 | Discharge: 2021-06-12 | Disposition: A | Discharge status: Home / Self Care | Payer: BC Managed Care – PPO | Attending: Obstetrics | Admitting: Obstetrics

## 2021-06-12 ENCOUNTER — Emergency Department
Admission: EM | Admit: 2021-06-12 | Discharge: 2021-06-12 | Disposition: A | Discharge status: Home / Self Care | Payer: BC Managed Care – PPO | Source: Home / Self Care | Attending: Emergency Medicine | Admitting: Emergency Medicine

## 2021-06-12 DIAGNOSIS — Z3A31 31 weeks gestation of pregnancy: Secondary | ICD-10-CM | POA: Diagnosis not present

## 2021-06-12 DIAGNOSIS — S80861A Insect bite (nonvenomous), right lower leg, initial encounter: Secondary | ICD-10-CM | POA: Insufficient documentation

## 2021-06-12 DIAGNOSIS — L03115 Cellulitis of right lower limb: Secondary | ICD-10-CM | POA: Insufficient documentation

## 2021-06-12 DIAGNOSIS — X58XXXA Exposure to other specified factors, initial encounter: Secondary | ICD-10-CM | POA: Insufficient documentation

## 2021-06-12 DIAGNOSIS — O368130 Decreased fetal movements, third trimester, not applicable or unspecified: Principal | ICD-10-CM | POA: Insufficient documentation

## 2021-06-12 DIAGNOSIS — O368190 Decreased fetal movements, unspecified trimester, not applicable or unspecified: Secondary | ICD-10-CM | POA: Diagnosis present

## 2021-06-12 MED ORDER — CEPHALEXIN 500 MG PO CAPS: 500.0000 mg | ORAL_CAPSULE | Freq: Three times a day (TID) | ORAL | 0 refills | Status: AC

## 2021-06-12 NOTE — ED Notes (Signed)
Discharge instructions reviewed with pt, pt calm , collective , denied pain or sob.  

## 2021-06-12 NOTE — ED Triage Notes (Signed)
Pt comes with mosquito bite on leg and states allergic reaction. No SOB or resp distress noted.

## 2021-06-12 NOTE — Discharge Summary (Signed)
Final Progress Note  Patient ID: Alexis Bolton MRN: [id number] DOB/AGE: 1996/07/04 25 y.o.  Admit date: 06/12/2021 Admitting provider: Mirna Mires, CNM Discharge date: 06/12/2021   Admission Diagnoses: decreased fetal movement  Discharge Diagnoses:  Active Problems:   Decreased fetal movement   [redacted] weeks gestation of pregnancy  Positive fetal movement Reactive fetal hearty tones  History of Present Illness: The patient is a 25 y.o. female G1P0 at [redacted]w[redacted]d who presents for an NST. She was seen the last hour in the Emergency Department for an infected mosquito bite. She tells the traige nurse that now her baby is moving, but previously, when questioned by the  ED staff, she had taken three doses of Benedryl, and her baby was not moving as much. She has been treated by the ED and is now [facility] for an NST.  Past Medical History:  Diagnosis Date   Eczema     History reviewed. No pertinent surgical history.  No current facility-administered medications on file prior to encounter.   Current Outpatient Medications on File Prior to Encounter  Medication Sig Dispense Refill   azelastine (ASTELIN) 0.1 % nasal spray 1-2 PUFF IN EACH NOSTRIL TWICE A DAY NASALLY 90     cephALEXin (KEFLEX) 500 MG capsule Take 1 capsule (500 mg total) by mouth 3 (three) times daily for 10 days. 30 capsule 0   fluticasone (FLONASE) 50 MCG/ACT nasal spray 1-2 SPRAY IN EACH NOSTRIL ONCE A DAY NASALLY 90     levocetirizine (XYZAL) 5 MG tablet SMARTSIG:1 Tablet(s) By Mouth Every Evening     montelukast (SINGULAIR) 10 MG tablet Take 1 tablet by mouth at bedtime.     EPINEPHrine 0.3 mg/0.3 mL IJ SOAJ injection See admin instructions.      Allergies  Allergen Reactions   Other     Nuts, spring mix causes itchy throat   Penicillins    Pomegranate [Punica]     Itchy throat    Social History   Socioeconomic History   Marital status: Single    Spouse name: Not on file   Number of children: Not on file    Years of education: Not on file   Highest education level: Not on file  Occupational History   Not on file  Tobacco Use   Smoking status: Never   Smokeless tobacco: Never  Vaping Use   Vaping Use: Not on file  Substance and Sexual Activity   Alcohol use: No    Alcohol/week: 0.0 standard drinks   Drug use: No   Sexual activity: Yes    Birth control/protection: None  Other Topics Concern   Not on file  Social History Narrative   Lives with mom, dad and sister   Social Determinants of Health   Financial Resource Strain: Not on file  Food Insecurity: Not on file  Transportation Needs: Not on file  Physical Activity: Not on file  Stress: Not on file  Social Connections: Not on file  Intimate Partner Violence: Not on file    History reviewed. No pertinent family history.   ROS   Physical Exam: BP (!) 119/56 (BP Location: Right Arm)   Pulse 73   Temp 97.7 F (36.5 C) (Oral)   Resp 18   Ht 5\' 2"  (1.575 m)   Wt 58.5 kg   LMP 11/02/2020 (Exact Date)   BMI 23.59 kg/m   OBGyn Exam  Consults: None  Significant Findings/ Diagnostic Studies: none  Procedures: EFM NST Baseline FHR: 125 beats/min Variability:  moderate Accelerations: present Decelerations: absent Tocometry: some uterine irritability, , post po hydration , now no uterine contraction activity noted.  Interpretation:  INDICATIONS: decreased fetal movement RESULTS:  A NST procedure was performed with FHR monitoring and a normal baseline established, appropriate time of 20-40 minutes of evaluation, and accels >2 seen w 15x15 characteristics.  Results show a REACTIVE NST.    Hospital Course: The patient was admitted to Labor and Delivery Triage for observation. She was monitored and the fetal heart tones were reactive and reassuring.  She was given ample po hydration to address some uterine, which then resolved.  Discharge Condition: good  Disposition: Discharge disposition: 01-Home or Self  Care       Diet: Regular diet  Discharge Activity: Activity as tolerated  Discharge Instructions     Discharge activity:  No Restrictions   Complete by: As directed    Discharge diet:  No restrictions   Complete by: As directed    Fetal Kick Count:  Lie on our left side for one hour after a meal, and count the number of times your baby kicks.  If it is less than 5 times, get up, move around and drink some juice.  Repeat the test 30 minutes later.  If it is still less than 5 kicks in an hour, notify your doctor.   Complete by: As directed    No sexual activity restrictions   Complete by: As directed    Notify physician for a general feeling that "something is not right"   Complete by: As directed    Notify physician for increase or change in vaginal discharge   Complete by: As directed    Notify physician for intestinal cramps, with or without diarrhea, sometimes described as "gas pain"   Complete by: As directed    Notify physician for leaking of fluid   Complete by: As directed    Notify physician for low, dull backache, unrelieved by heat or Tylenol   Complete by: As directed    Notify physician for menstrual like cramps   Complete by: As directed    Notify physician for pelvic pressure   Complete by: As directed    Notify physician for uterine contractions.  These may be painless and feel like the uterus is tightening or the baby is  "balling up"   Complete by: As directed    Notify physician for vaginal bleeding   Complete by: As directed    PRETERM LABOR:  Includes any of the follwing symptoms that occur between 20 - [redacted] weeks gestation.  If these symptoms are not stopped, preterm labor can result in preterm delivery, placing your baby at risk   Complete by: As directed       Allergies as of 06/12/2021       Reactions   Other    Nuts, spring mix causes itchy throat   Penicillins    Pomegranate [punica]    Itchy throat        Medication List     TAKE these  medications    azelastine 0.1 % nasal spray Commonly known as: ASTELIN 1-2 PUFF IN EACH NOSTRIL TWICE A DAY NASALLY 90   cephALEXin 500 MG capsule Commonly known as: KEFLEX Take 1 capsule (500 mg total) by mouth 3 (three) times daily for 10 days.   EPINEPHrine 0.3 mg/0.3 mL Soaj injection Commonly known as: EPI-PEN See admin instructions.   fluticasone 50 MCG/ACT nasal spray Commonly known as: FLONASE 1-2 SPRAY IN  EACH NOSTRIL ONCE A DAY NASALLY 90   levocetirizine 5 MG tablet Commonly known as: XYZAL SMARTSIG:1 Tablet(s) By Mouth Every Evening   montelukast 10 MG tablet Commonly known as: SINGULAIR Take 1 tablet by mouth at bedtime.         Total time spent taking care of this patient: 25 minutes  Signed: Mirna Mires, CNM  06/12/2021, 5:38 PM

## 2021-06-12 NOTE — Progress Notes (Signed)
Patient given discharge instructions. Patient verbalizes understanding of discharge instructions and has no questions. Patient ambulatory home in good condition.

## 2021-06-12 NOTE — ED Provider Notes (Signed)
[facility] Emergency Department Provider Note ____________________________________________   Event Date/Time   First MD Initiated Contact with Patient 06/12/21 1317     (approximate)  I have reviewed the triage vital signs and the nursing notes.   HISTORY  Chief Complaint Insect Bite  HPI Alexis Bolton is a 25 y.o. female with history of asthma, seasonal allergies, and [redacted] weeks pregnant presents to the emergency department for treatment and evaluation of lesion on the posterior right calf that started 2 days ago. She believes the area started as a mosquito bite but has continued to get more erythematous and painful when she is walking.  She also mentions decrease in fetal movement.  She has contacted her gynecologist and will go upstairs for monitoring after ensuring that she is medically cleared from DVT.         Past Medical History:  Diagnosis Date   Eczema     Patient Active Problem List   Diagnosis Date Noted   Acute knee pain 04/12/2021   Supervision of normal first pregnancy 02/24/2021   Positive pregnancy test 12/09/2020   Seasonal allergic rhinitis 07/31/2016   Exercise-induced asthma 09/26/2012    No past surgical history on file.  Prior to Admission medications   Medication Sig Start Date End Date Taking? Authorizing Provider  cephALEXin (KEFLEX) 500 MG capsule Take 1 capsule (500 mg total) by mouth 3 (three) times daily for 10 days. 06/12/21 06/22/21 Yes Elowyn Raupp B, FNP  azelastine (ASTELIN) 0.1 % nasal spray 1-2 PUFF IN EACH NOSTRIL TWICE A DAY NASALLY 90 04/01/21   [provider]  EPINEPHrine 0.3 mg/0.3 mL IJ SOAJ injection See admin instructions.    [provider]  fluticasone (FLONASE) 50 MCG/ACT nasal spray 1-2 SPRAY IN EACH NOSTRIL ONCE A DAY NASALLY 90    [provider]  levocetirizine (XYZAL) 5 MG tablet SMARTSIG:1 Tablet(s) By Mouth Every Evening 04/01/21   [provider]   montelukast (SINGULAIR) 10 MG tablet Take 1 tablet by mouth at bedtime. 04/01/21   [provider]    Allergies Other, Penicillins, and Pomegranate [punica]  No family history on file.  Social History Social History   Tobacco Use   Smoking status: Never   Smokeless tobacco: Never  Substance Use Topics   Alcohol use: No    Alcohol/week: 0.0 standard drinks   Drug use: No    Review of Systems  Constitutional: No fever/chills Eyes: No visual changes. ENT: No sore throat. Cardiovascular: Denies chest pain. Respiratory: Denies shortness of breath. Gastrointestinal: No abdominal pain.  No nausea, no vomiting.  No diarrhea. Genitourinary: Negative for dysuria. Musculoskeletal: Negative for back pain. Skin: Negative for rash. Neurological: Negative for headaches, focal weakness or numbness.  ____________________________________________   PHYSICAL EXAM:  VITAL SIGNS: ED Triage Vitals  Enc Vitals Group     BP 06/12/21 1301 (!) 142/93     Pulse Rate 06/12/21 1301 98     Resp 06/12/21 1301 20     Temp 06/12/21 1301 98.3 F (36.8 C)     Temp Source 06/12/21 1301 Oral     SpO2 06/12/21 1301 100 %     Weight 06/12/21 1301 129 lb (58.5 kg)     Height 06/12/21 1301 5\' 2"  (1.575 m)     Head Circumference --      Peak Flow --      Pain Score 06/12/21 1300 7     Pain Loc --  Pain Edu? --      Excl. in GC? --     Constitutional: Alert and oriented. Well appearing and in no acute distress. Eyes: Conjunctivae are normal.  Head: Atraumatic. Nose: No congestion/rhinnorhea. Mouth/Throat: Mucous membranes are moist. Oropharynx non-erythematous. Neck: No stridor.   Hematological/Lymphatic/Immunilogical: No cervical lymphadenopathy. Cardiovascular: Normal rate, regular rhythm. Grossly normal heart sounds.  Good peripheral circulation. Respiratory: Normal respiratory effort.  No retractions. Lungs CTAB. Gastrointestinal: Soft and nontender. No distention. No  abdominal bruits. Genitourinary:  Musculoskeletal: No lower extremity tenderness nor edema.  No joint effusions. Neurologic:  Normal speech and language. No gross focal neurologic deficits are appreciated. No gait instability. Skin:  Skin is warm, dry and intact. No rash noted. Psychiatric: Mood and affect are normal. Speech and behavior are normal.  ____________________________________________   LABS (all labs ordered are listed, but only abnormal results are displayed)  Labs Reviewed - No data to display ____________________________________________  EKG  Not indicated ____________________________________________  RADIOLOGY  ED MD interpretation:    Ultrasound of the right lower extremity is negative for acute concerns, specifically no indication of DVT. I, Kem Boroughs, personally viewed and evaluated these images (plain radiographs) as part of my medical decision making, as well as reviewing the written report by the radiologist.  Official radiology report(s): US Venous Img Lower Unilateral Right  Result Date: 06/12/2021 CLINICAL DATA:  Right calf pain concern for DVT EXAM: Right LOWER EXTREMITY VENOUS DOPPLER ULTRASOUND TECHNIQUE: Gray-scale sonography with compression, as well as color and duplex ultrasound, were performed to evaluate the deep venous system(s) from the level of the common femoral vein through the popliteal and proximal calf veins. COMPARISON:  None. FINDINGS: VENOUS Normal compressibility of the common femoral, superficial femoral, and popliteal veins, as well as the visualized calf veins. Visualized portions of profunda femoral vein and great saphenous vein unremarkable. No filling defects to suggest DVT on grayscale or color Doppler imaging. Doppler waveforms show normal direction of venous flow, normal respiratory plasticity and response to augmentation. Limited views of the contralateral common femoral vein are unremarkable. OTHER None. Limitations: none  IMPRESSION: Negative study for DVT. Electronically Signed   By: Maudry Mayhew MD   On: 06/12/2021 15:29    ____________________________________________   PROCEDURES  Procedure(s) performed (including Critical Care):  Procedures  ____________________________________________   INITIAL IMPRESSION / ASSESSMENT AND PLAN     25 year old female presenting to the emergency department for treatment and evaluation of right lower extremity pain.  See HPI for further details.  Exam does appear to be may be an early cellulitis but venous ultrasound will be completed to ensure there is no DVT as she is [redacted] weeks pregnant.  She has no history of DVT and denies chest pain or shortness of breath.  DIFFERENTIAL DIAGNOSIS  Cellulitis, insect bite, DVT  ED COURSE  Venous ultrasound negative for acute concerns.  Due to the increase in erythema and pain with weightbearing, we will put her on a course of Keflex for early cellulitis.  Upon discharge, she will go to labor and delivery for monitoring as she also reports a decrease in fetal movement today.    ___________________________________________   FINAL CLINICAL IMPRESSION(S) / ED DIAGNOSES  Final diagnoses:  Cellulitis of right lower extremity     ED Discharge Orders          Ordered    cephALEXin (KEFLEX) 500 MG capsule  3 times daily        06/12/21 1553  Audreyanna A Sciascia was evaluated in Emergency Department on 06/12/2021 for the symptoms described in the history of present illness. She was evaluated in the context of the global COVID-19 pandemic, which necessitated consideration that the patient might be at risk for infection with the SARS-CoV-2 virus that causes COVID-19. Institutional protocols and algorithms that pertain to the evaluation of patients at risk for COVID-19 are in a state of rapid change based on information released by regulatory bodies including the CDC and federal and state organizations. These  policies and algorithms were followed during the patient's care in the ED.   Note:  This document was prepared using Dragon voice recognition software and may include unintentional dictation errors.    Chinita Pester, FNP 06/12/21 1624    Merwyn Katos, MD 06/13/21 [phone]

## 2021-06-12 NOTE — ED Notes (Addendum)
See triage note  Presents with possible allergic rxn to insect bite  States area is more red and swollen    Sent in by OB/GYN to r/o dvt  Pt is [redacted] weeks pregnant

## 2021-06-12 NOTE — ED Triage Notes (Addendum)
Pt via POV from home. Pt has a mosquito bite of the posterior aspects of the pt's lower R leg. Pt was bite 2 days ago. Pt states the swelling and pain is worse today. Pt has been taking benadryl with no relief. Pt is A&Ox4 and NAD. Ambulatory to triage. No respiratory distress at this time. Pt is [redacted] weeks pregnant.

## 2021-06-12 NOTE — OB Triage Note (Signed)
Pt presents from ED with complaints of decreased fetal movement today following taking benadryl every 4 hours yesterday for a mosquito bite that caused significant swelling of her leg. Pt was cleared in ED for a DVT and [facility]. Pt denies VB, LOF and states she has felt the baby move since being put on the monitor. EFM applied and initial FHT 135. CNM notified of patient Arrival to unit.

## 2021-06-12 NOTE — Discharge Summary (Signed)
Please see Final Progress Note.  Mirna Mires, CNM  06/12/2021 5:35 PM

## 2021-06-16 ENCOUNTER — Encounter: Payer: Self-pay | Admitting: Advanced Practice Midwife

## 2021-06-16 ENCOUNTER — Other Ambulatory Visit: Payer: Self-pay

## 2021-06-16 ENCOUNTER — Ambulatory Visit (INDEPENDENT_AMBULATORY_CARE_PROVIDER_SITE_OTHER): Payer: Medicaid Other | Admitting: Advanced Practice Midwife

## 2021-06-16 VITALS — BP 100/66 | Wt 130.0 lb

## 2021-06-16 DIAGNOSIS — Z3A32 32 weeks gestation of pregnancy: Secondary | ICD-10-CM

## 2021-06-16 DIAGNOSIS — Z3403 Encounter for supervision of normal first pregnancy, third trimester: Secondary | ICD-10-CM

## 2021-06-16 LAB — POCT URINALYSIS DIPSTICK OB
Glucose, UA: NEGATIVE
POC,PROTEIN,UA: NEGATIVE

## 2021-06-16 NOTE — Progress Notes (Signed)
ROB - no concerns. RM 5 

## 2021-06-16 NOTE — Progress Notes (Signed)
  Routine Prenatal Care Visit  Subjective  Alexis Bolton is a 25 y.o. G1P0 at [redacted]w[redacted]d being seen today for ongoing prenatal care.  She is currently monitored for the following issues for this low-risk pregnancy and has Exercise-induced asthma; Seasonal allergic rhinitis; Positive pregnancy test; Supervision of normal first pregnancy; Acute knee pain; Decreased fetal movement; and [redacted] weeks gestation of pregnancy on their problem list.  ----------------------------------------------------------------------------------- Patient reports no complaints.   Contractions: Not present. Vag. Bleeding: None.  Movement: Present. Leaking Fluid denies.  ----------------------------------------------------------------------------------- The following portions of the patient's history were reviewed and updated as appropriate: allergies, current medications, past family history, past medical history, past social history, past surgical history and problem list. Problem list updated.  Objective  Blood pressure 100/66, weight 130 lb (59 kg), last menstrual period 11/02/2020. Pregravid weight 100 lb (45.4 kg) Total Weight Gain 30 lb (13.6 kg) Urinalysis: Urine Protein Negative  Urine Glucose Negative  Fetal Status: Fetal Heart Rate (bpm): 143 Fundal Height: 32 cm Movement: Present     General:  Alert, oriented and cooperative. Patient is in no acute distress.  Skin: Skin is warm and dry. No rash noted.   Cardiovascular: Normal heart rate noted  Respiratory: Normal respiratory effort, no problems with respiration noted  Abdomen: Soft, gravid, appropriate for gestational age. Pain/Pressure: Absent     Pelvic:  Cervical exam deferred        Extremities: Normal range of motion.  Edema: None  Mental Status: Normal mood and affect. Normal behavior. Normal judgment and thought content.   Assessment   25 y.o. G1P0 at [redacted]w[redacted]d by  08/09/2021, by Last Menstrual Period presenting for routine prenatal visit  Plan    [email or web address]  Problems (from 11/02/20 to present)    Problem Noted Resolved   Supervision of normal first pregnancy 02/24/2021 by Tresea Mall, CNM No   Overview Addendum 06/02/2021  2:33 PM by Tresea Mall, CNM     Nursing Staff Provider  Office Location  [facility] Dating   LMP = 11 wk [place]  Language  English Anatomy US  Complete  Flu Vaccine   Genetic Screen  NIPS: Normal XX  TDaP vaccine   06/02/21 Hgb A1C or  GTT Early : Third trimester :   Covid unvaccinated   LAB RESULTS   Rhogam  Not needed Blood Type   A+  Feeding Plan Breast Antibody  negative  Contraception pill Rubella   NI  Circumcision  RPR NON-REACTIVE (09/21 0912)   Pediatrician   HBsAg   nonreactive  Support Person  Adam HIV NON-REACTIVE (09/21 3790)  Prenatal Classes Discussed Varicella NI    GBS  (For PCN allergy, check sensitivities)   BTL Consent     VBAC Consent  Pap  2021 NIL    Hgb Electro      CF      SMA                   Preterm labor symptoms and general obstetric precautions including but not limited to vaginal bleeding, contractions, leaking of fluid and fetal movement were reviewed in detail with the patient.    Return in about 2 weeks (around 06/30/2021) for rob.  Tresea Mall, CNM 06/16/2021 2:05 PM

## 2021-06-22 ENCOUNTER — Telehealth: Payer: Self-pay

## 2021-06-22 NOTE — Telephone Encounter (Signed)
Pt calling; has swelling in face and a little pain; should she go see dentist or be seen somewhere else?  [phone]  Pt states it started a couple days ago; thought she has maybe pulled a neck muscle; then yesterday it moved to the jawline with some swelling; today her whole right cheek is swollen.  Adv to see dentist; if needs our dental protocol faxed to them to let us know.

## 2021-06-30 ENCOUNTER — Other Ambulatory Visit: Payer: Self-pay

## 2021-06-30 ENCOUNTER — Encounter: Payer: Self-pay | Admitting: Advanced Practice Midwife

## 2021-06-30 ENCOUNTER — Ambulatory Visit (INDEPENDENT_AMBULATORY_CARE_PROVIDER_SITE_OTHER): Payer: Medicaid Other | Admitting: Advanced Practice Midwife

## 2021-06-30 VITALS — BP 120/80 | Wt 133.0 lb

## 2021-06-30 DIAGNOSIS — Z3403 Encounter for supervision of normal first pregnancy, third trimester: Secondary | ICD-10-CM

## 2021-06-30 DIAGNOSIS — Z3A34 34 weeks gestation of pregnancy: Secondary | ICD-10-CM

## 2021-06-30 LAB — POCT URINALYSIS DIPSTICK OB
Glucose, UA: NEGATIVE
POC,PROTEIN,UA: NEGATIVE

## 2021-06-30 NOTE — Addendum Note (Signed)
Addended by: Cornelius Moras D on: 06/30/2021 02:47 PM   Modules accepted: Orders

## 2021-06-30 NOTE — Progress Notes (Signed)
  Routine Prenatal Care Visit  Subjective  Alexis Bolton is a 25 y.o. G1P0 at [redacted]w[redacted]d being seen today for ongoing prenatal care.  She is currently monitored for the following issues for this low-risk pregnancy and has Exercise-induced asthma; Seasonal allergic rhinitis; Positive pregnancy test; Supervision of normal first pregnancy; Acute knee pain; Decreased fetal movement; and [redacted] weeks gestation of pregnancy on their problem list.  ----------------------------------------------------------------------------------- Patient reports no complaints. [email or web address] website given so she can take breastfeeding class.  Contractions: Not present. Vag. Bleeding: None.  Movement: Present. Leaking Fluid denies.  ----------------------------------------------------------------------------------- The following portions of the patient's history were reviewed and updated as appropriate: allergies, current medications, past family history, past medical history, past social history, past surgical history and problem list. Problem list updated.  Objective  Blood pressure 120/80, weight 133 lb (60.3 kg), last menstrual period 11/02/2020. Pregravid weight 100 lb (45.4 kg) Total Weight Gain 33 lb (15 kg) Urinalysis: Urine Protein    Urine Glucose    Fetal Status: Fetal Heart Rate (bpm): 145 Fundal Height: 34 cm Movement: Present     General:  Alert, oriented and cooperative. Patient is in no acute distress.  Skin: Skin is warm and dry. No rash noted.   Cardiovascular: Normal heart rate noted  Respiratory: Normal respiratory effort, no problems with respiration noted  Abdomen: Soft, gravid, appropriate for gestational age. Pain/Pressure: Absent     Pelvic:  Cervical exam deferred        Extremities: Normal range of motion.  Edema: None  Mental Status: Normal mood and affect. Normal behavior. Normal judgment and thought content.   Assessment   25 y.o. G1P0 at [redacted]w[redacted]d by  08/09/2021, by Last Menstrual  Period presenting for routine prenatal visit  Plan   pregnancy1  Problems (from 11/02/20 to present)    Problem Noted Resolved   Supervision of normal first pregnancy 02/24/2021 by Tresea Mall, CNM No   Overview Addendum 06/02/2021  2:33 PM by Tresea Mall, CNM     Nursing Staff Provider  Office Location  [facility] Dating   LMP = 11 wk [place]  Language  English Anatomy US  Complete  Flu Vaccine   Genetic Screen  NIPS: Normal XX  TDaP vaccine   06/02/21 Hgb A1C or  GTT Early : Third trimester :   Covid unvaccinated   LAB RESULTS   Rhogam  Not needed Blood Type   A+  Feeding Plan Breast Antibody  negative  Contraception pill Rubella   NI  Circumcision  RPR NON-REACTIVE (09/21 0912)   Pediatrician   HBsAg   nonreactive  Support Person  Adam HIV NON-REACTIVE (09/21 6503)  Prenatal Classes Discussed Varicella NI    GBS  (For PCN allergy, check sensitivities)   BTL Consent     VBAC Consent  Pap  2021 NIL    Hgb Electro      CF      SMA                   Preterm labor symptoms and general obstetric precautions including but not limited to vaginal bleeding, contractions, leaking of fluid and fetal movement were reviewed in detail with the patient.   Return in about 2 weeks (around 07/14/2021) for rob.  Tresea Mall, CNM 06/30/2021 2:44 PM

## 2021-07-06 NOTE — Telephone Encounter (Signed)
Please advise 

## 2021-07-07 ENCOUNTER — Other Ambulatory Visit: Payer: Self-pay

## 2021-07-07 ENCOUNTER — Ambulatory Visit (INDEPENDENT_AMBULATORY_CARE_PROVIDER_SITE_OTHER): Payer: BC Managed Care – PPO | Admitting: Obstetrics and Gynecology

## 2021-07-07 VITALS — BP 120/70 | Ht 62.0 in | Wt 138.6 lb

## 2021-07-07 DIAGNOSIS — O26843 Uterine size-date discrepancy, third trimester: Secondary | ICD-10-CM

## 2021-07-07 LAB — POCT URINALYSIS DIPSTICK OB
Glucose, UA: NEGATIVE
POC,PROTEIN,UA: NEGATIVE

## 2021-07-07 NOTE — Progress Notes (Signed)
Routine Prenatal Care Visit  Subjective  Alexis Bolton is a 25 y.o. G1P0 at [redacted]w[redacted]d being seen today for ongoing prenatal care.  She is currently monitored for the following issues for this low-risk pregnancy and has Exercise-induced asthma; Seasonal allergic rhinitis; Positive pregnancy test; Supervision of normal first pregnancy; Acute knee pain; Decreased fetal movement; and [redacted] weeks gestation of pregnancy on their problem list.  ----------------------------------------------------------------------------------- Patient reports  that she noticed her underwear were soaked today.  This occurred 2 times.  She was concerned that her water ruptured and was instructed to come in for visit. .   Contractions: Irregular. Vag. Bleeding: None.  Movement: Present. Denies leaking of fluid.  ----------------------------------------------------------------------------------- The following portions of the patient's history were reviewed and updated as appropriate: allergies, current medications, past family history, past medical history, past social history, past surgical history and problem list. Problem list updated.   Objective  Blood pressure 120/70, height 5\' 2"  (1.575 m), weight 138 lb 9.6 oz (62.9 kg), last menstrual period 11/02/2020. Pregravid weight 100 lb (45.4 kg) Total Weight Gain 38 lb 9.6 oz (17.5 kg) Urinalysis:      Fetal Status: Fetal Heart Rate (bpm): 137 Fundal Height: 30 cm Movement: Present  Presentation: Vertex  General:  Alert, oriented and cooperative. Patient is in no acute distress.  Skin: Skin is warm and dry. No rash noted.   Cardiovascular: Normal heart rate noted  Respiratory: Normal respiratory effort, no problems with respiration noted  Abdomen: Soft, gravid, appropriate for gestational age. Pain/Pressure: Absent     Pelvic:  Cervical exam performed Dilation: Fingertip Effacement (%): 20 Station: -3  Extremities: Normal range of motion.  Edema: None  Mental Status:  Normal mood and affect. Normal behavior. Normal judgment and thought content.     Assessment   26 y.o. G1P0 at [redacted]w[redacted]d by  08/09/2021, by Last Menstrual Period presenting for work-in prenatal visit  Plan   pregnancy1  Problems (from 11/02/20 to present)     Problem Noted Resolved   Supervision of normal first pregnancy 02/24/2021 by 02/26/2021, CNM No   Overview Addendum 06/02/2021  2:33 PM by 06/04/2021, CNM     Nursing Staff Provider  Office Location  [facility] Dating   LMP = 11 wk Tresea Mall  Language  English Anatomy US  Complete  Flu Vaccine   Genetic Screen  NIPS: Normal XX  TDaP vaccine   06/02/21 Hgb A1C or  GTT Early : Third trimester :   Covid unvaccinated   LAB RESULTS   Rhogam  Not needed Blood Type   A+  Feeding Plan Breast Antibody  negative  Contraception pill Rubella   NI  Circumcision  RPR NON-REACTIVE (09/21 0912)   Pediatrician   HBsAg   nonreactive  Support Person  Adam HIV NON-REACTIVE (09/21 [birth year]-1986)  Prenatal Classes Discussed Varicella NI    GBS  (For PCN allergy, check sensitivities)   BTL Consent     VBAC Consent  Pap  2021 NIL    Hgb Electro      CF      SMA                  Speculum exam did not show any pooling of fluid.  Ferning under microscope was negative.  Bedside AFI was 14.6.  Patient was reassured.  Asked to come to labor and delivery if continued leakage is experienced for ROM plus.   Gestational age appropriate obstetric precautions including but not  limited to vaginal bleeding, contractions, leaking of fluid and fetal movement were reviewed in detail with the patient.    Follow-up as planned in 1 week  Natale Milch MD [facility], [facility] 07/07/2021, 4:04 PM     AFI 14 cm

## 2021-07-07 NOTE — Telephone Encounter (Signed)
Pt called late morning; is 35w; cramping x3d; wetness in underwear.  [phone]  Pt states she went to the bathroom and notice underwear was wet; changed underwear; when she went back to the bathroom underwear was moist; tx'd to [facility] for scheduling.

## 2021-07-11 ENCOUNTER — Other Ambulatory Visit: Payer: Self-pay

## 2021-07-11 ENCOUNTER — Ambulatory Visit (INDEPENDENT_AMBULATORY_CARE_PROVIDER_SITE_OTHER): Payer: BC Managed Care – PPO | Admitting: Obstetrics

## 2021-07-11 VITALS — BP 100/60 | Wt 135.0 lb

## 2021-07-11 DIAGNOSIS — Z3A35 35 weeks gestation of pregnancy: Secondary | ICD-10-CM

## 2021-07-11 DIAGNOSIS — Z3403 Encounter for supervision of normal first pregnancy, third trimester: Secondary | ICD-10-CM

## 2021-07-11 LAB — POCT URINALYSIS DIPSTICK OB
Glucose, UA: NEGATIVE
POC,PROTEIN,UA: NEGATIVE

## 2021-07-11 NOTE — Progress Notes (Signed)
  Routine Prenatal Care Visit  Subjective  Alexis Bolton is a 25 y.o. G1P0 at [redacted]w[redacted]d being seen today for ongoing prenatal care.  She is currently monitored for the following issues for this low-risk pregnancy and has Exercise-induced asthma; Seasonal allergic rhinitis; Positive pregnancy test; Supervision of normal first pregnancy; Acute knee pain; Decreased fetal movement; and [redacted] weeks gestation of pregnancy on their problem list.  ----------------------------------------------------------------------------------- Patient reports no complaints.    .  .   [name] denies.  ----------------------------------------------------------------------------------- The following portions of the patient's history were reviewed and updated as appropriate: allergies, current medications, past family history, past medical history, past social history, past surgical history and problem list. Problem list updated.  Objective  Blood pressure 100/60, weight 135 lb (61.2 kg), last menstrual period 11/02/2020. Pregravid weight 100 lb (45.4 kg) Total Weight Gain 35 lb (15.9 kg) Urinalysis: Urine Protein Negative  Urine Glucose Negative  Fetal Status:           General:  Alert, oriented and cooperative. Patient is in no acute distress.  Skin: Skin is warm and dry. No rash noted.   Cardiovascular: Normal heart rate noted  Respiratory: Normal respiratory effort, no problems with respiration noted  Abdomen: Soft, gravid, appropriate for gestational age.       Pelvic:  Cervical exam deferred        Extremities: Normal range of motion.     Mental Status: Normal mood and affect. Normal behavior. Normal judgment and thought content.   Assessment   25 y.o. G1P0 at [redacted]w[redacted]d by  08/09/2021, by Last Menstrual Period presenting for routine prenatal visit  Plan   pregnancy1  Problems (from 11/02/20 to present)    Problem Noted Resolved   Supervision of normal first pregnancy 02/24/2021 by Tresea Mall, CNM No    Overview Addendum 06/02/2021  2:33 PM by Tresea Mall, CNM     Nursing Staff Provider  Office Location  [facility] Dating   LMP = 11 wk [place]  Language  English Anatomy US  Complete  Flu Vaccine   Genetic Screen  NIPS: Normal XX  TDaP vaccine   06/02/21 Hgb A1C or  GTT Early : Third trimester :   Covid unvaccinated   LAB RESULTS   Rhogam  Not needed Blood Type   A+  Feeding Plan Breast Antibody  negative  Contraception pill Rubella   NI  Circumcision  RPR NON-REACTIVE (09/21 0912)   Pediatrician   HBsAg   nonreactive  Support Person  Adam HIV NON-REACTIVE (09/21 4010)  Prenatal Classes Discussed Varicella NI    GBS  (For PCN allergy, check sensitivities)   BTL Consent     VBAC Consent  Pap  2021 NIL    Hgb Electro      CF      SMA                   Term labor symptoms and general obstetric precautions including but not limited to vaginal bleeding, contractions, leaking of fluid and fetal movement were reviewed in detail with the patient. Please refer to After Visit Summary for other counseling recommendations.   Return in about 1 week (around 07/18/2021) for return OB GBS retrieved today  Mirna Mires, CNM  07/11/2021 12:34 PM

## 2021-07-11 NOTE — Progress Notes (Signed)
ROB- no concerns 

## 2021-07-13 ENCOUNTER — Other Ambulatory Visit: Payer: Self-pay

## 2021-07-13 ENCOUNTER — Encounter: Payer: Self-pay | Admitting: Obstetrics and Gynecology

## 2021-07-13 ENCOUNTER — Observation Stay
Admission: EM | Admit: 2021-07-13 | Discharge: 2021-07-13 | Disposition: A | Discharge status: Home / Self Care | Payer: BC Managed Care – PPO | Attending: Obstetrics and Gynecology | Admitting: Obstetrics and Gynecology

## 2021-07-13 DIAGNOSIS — O9952 Diseases of the respiratory system complicating childbirth: Secondary | ICD-10-CM | POA: Insufficient documentation

## 2021-07-13 DIAGNOSIS — O4703 False labor before 37 completed weeks of gestation, third trimester: Principal | ICD-10-CM | POA: Insufficient documentation

## 2021-07-13 DIAGNOSIS — O479 False labor, unspecified: Secondary | ICD-10-CM | POA: Diagnosis present

## 2021-07-13 DIAGNOSIS — J45909 Unspecified asthma, uncomplicated: Secondary | ICD-10-CM | POA: Insufficient documentation

## 2021-07-13 DIAGNOSIS — Z3A36 36 weeks gestation of pregnancy: Secondary | ICD-10-CM | POA: Insufficient documentation

## 2021-07-13 DIAGNOSIS — Z3403 Encounter for supervision of normal first pregnancy, third trimester: Secondary | ICD-10-CM

## 2021-07-13 HISTORY — DX: Unspecified asthma, uncomplicated: J45.909

## 2021-07-13 MED ORDER — HYDROXYZINE HCL 25 MG PO TABS
25.0000 mg | ORAL_TABLET | Freq: Four times a day (QID) | ORAL | Status: DC | PRN
  Administered 2021-07-13: 25 mg via ORAL
  Filled 2021-07-13 (×3): qty 1

## 2021-07-13 NOTE — OB Triage Note (Signed)
Pt G1P0 [redacted]w[redacted]d [facility] w/ c/o ctx that started Monday and started becoming more consistent and painful today. Rating pain 9/10 and states the ctx's make her legs go numb and tingle, which happens to her during her periods. Reports no vag bleeding, positive fetal movement. States she has had some white vaginal discharge. Monitors applied and assessing. VSS. Initial FHT's in 130's.

## 2021-07-13 NOTE — Final Progress Note (Signed)
Final Progress Note  Patient ID: Alexis Bolton MRN: [id number] DOB/AGE: 1996/09/11 25 y.o.  Admit date: 07/13/2021 Admitting provider: Mirna Mires, CNM Discharge date: 07/13/2021   Admission Diagnoses: irregular uterine contractions   Discharge Diagnoses:   Uterine irritability Reactive fetal heart tones  History of Present Illness: The patient is a 25 y.o. female G1P0 at [redacted]w[redacted]d who presents for evaluation of contractions that started earlier today, and then became stronger in the last few hours. This is her first pregnancy.. She denies any vaginal bleeding or leaking of fluid. Her baby is moving well. 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 She denies any recent intercourse. Her mother is with her and is supportive.  Past Medical History:  Diagnosis Date   Asthma    Eczema     Past Surgical History:  Procedure Laterality Date   NO PAST SURGERIES      No current facility-administered medications on file prior to encounter.   Current Outpatient Medications on File Prior to Encounter  Medication Sig Dispense Refill   acetaminophen (TYLENOL) 325 MG tablet Take 650 mg by mouth every 6 (six) hours as needed.     azelastine (ASTELIN) 0.1 % nasal spray 1-2 PUFF IN EACH NOSTRIL TWICE A DAY NASALLY 90     EPINEPHrine 0.3 mg/0.3 mL IJ SOAJ injection See admin instructions.     fluticasone (FLONASE) 50 MCG/ACT nasal spray 1-2 SPRAY IN EACH NOSTRIL ONCE A DAY NASALLY 90     levocetirizine (XYZAL) 5 MG tablet SMARTSIG:1 Tablet(s) By Mouth Every Evening     montelukast (SINGULAIR) 10 MG tablet Take 1 tablet by mouth at bedtime.      Allergies  Allergen Reactions   Other     Nuts, spring mix causes itchy throat   Penicillins    Pomegranate [Punica]     Itchy throat    Social History   Socioeconomic History   Marital status: Single    Spouse name: Not on file   Number of children: Not on file   Years of education: Not on file   Highest education level: Not on file   Occupational History   Not on file  Tobacco Use   Smoking status: Never   Smokeless tobacco: Never  Vaping Use   Vaping Use: Not on file  Substance and Sexual Activity   Alcohol use: No    Alcohol/week: 0.0 standard drinks   Drug use: No   Sexual activity: Yes    Birth control/protection: None  Other Topics Concern   Not on file  Social History Narrative   Lives with mom, dad and sister   Social Determinants of Health   Financial Resource Strain: Not on file  Food Insecurity: Not on file  Transportation Needs: Not on file  Physical Activity: Not on file  Stress: Not on file  Social Connections: Not on file  Intimate Partner Violence: Not on file    History reviewed. No pertinent family history.   ROS   Physical Exam: BP 135/72 (BP Location: Left Arm)   Pulse 86   Resp 18   LMP 11/02/2020 (Exact Date)   OBGyn Exam  Consults: None  Significant Findings/ Diagnostic Studies: EFM only  Procedures: EFM NST Baseline FHR: 120 baseline beats/min Variability: moderate Accelerations: present Decelerations: absent Tocometry: mild irregular contractions, that palpate mild. SVE: closed-FT/30%effaced/-3 station. Cervix is posterior  Interpretation:  INDICATIONS: rule out uterine contractions RESULTS:  A NST procedure was performed with FHR monitoring and a normal baseline  established, appropriate time of 20-40 minutes of evaluation, and accels >2 seen w 15x15 characteristics.  Results show a REACTIVE NST.    Hospital Course: The patient was admitted to Labor and Delivery Triage for observation. She was placed on the fetal monitor, where FHTS were reactive and reassuring. Her cervix was checked and not found to be dilated. She was provided oral hydration, and with a reactive FHT strip and no dilation, discharged home to rest with a warm bath and rest with one dose of Vistaril. She will [facility] up with [facility] for her next OB visit, or return to labor and Delivery if her  contractions increase in strength.  Discharge Condition: good  Disposition:  Discharge disposition: 01-Home or Self Care      Diet: Regular diet  Discharge Activity: Activity as tolerated  Discharge Instructions     Discharge activity:  No Restrictions   Complete by: As directed    Fetal Kick Count:  Lie on our left side for one hour after a meal, and count the number of times your baby kicks.  If it is less than 5 times, get up, move around and drink some juice.  Repeat the test 30 minutes later.  If it is still less than 5 kicks in an hour, notify your doctor.   Complete by: As directed    LABOR:  When conractions begin, you should start to time them from the beginning of one contraction to the beginning  of the next.  When contractions are 5 - 10 minutes apart or less and have been regular for at least an hour, you should call your health care provider.   Complete by: As directed    Notify physician for bleeding from the vagina   Complete by: As directed    Notify physician for blurring of vision or spots before the eyes   Complete by: As directed    Notify physician for chills or fever   Complete by: As directed    Notify physician for fainting spells, "black outs" or loss of consciousness   Complete by: As directed    Notify physician for increase in vaginal discharge   Complete by: As directed    Notify physician for leaking of fluid   Complete by: As directed    Notify physician for pain or burning when urinating   Complete by: As directed    Notify physician for pelvic pressure (sudden increase)   Complete by: As directed    Notify physician for severe or continued nausea or vomiting   Complete by: As directed    Notify physician for sudden gushing of fluid from the vagina (with or without continued leaking)   Complete by: As directed    Notify physician for sudden, constant, or occasional abdominal pain   Complete by: As directed    Notify physician if baby  moving less than usual   Complete by: As directed       Allergies as of 07/13/2021       Reactions   Other    Nuts, spring mix causes itchy throat   Penicillins    Pomegranate [punica]    Itchy throat        Medication List     TAKE these medications    acetaminophen 325 MG tablet Commonly known as: TYLENOL Take 650 mg by mouth every 6 (six) hours as needed.   azelastine 0.1 % nasal spray Commonly known as: ASTELIN 1-2 PUFF IN EACH NOSTRIL TWICE  A DAY NASALLY 90   EPINEPHrine 0.3 mg/0.3 mL Soaj injection Commonly known as: EPI-PEN See admin instructions.   fluticasone 50 MCG/ACT nasal spray Commonly known as: FLONASE 1-2 SPRAY IN EACH NOSTRIL ONCE A DAY NASALLY 90   levocetirizine 5 MG tablet Commonly known as: XYZAL SMARTSIG:1 Tablet(s) By Mouth Every Evening   montelukast 10 MG tablet Commonly known as: SINGULAIR Take 1 tablet by mouth at bedtime.         Total time spent taking care of this patient: 35 minutes  Signed: Mirna Mires, CNM  07/13/2021, 10:24 PM

## 2021-07-13 NOTE — Discharge Summary (Signed)
See Final Progress Note  Mirna Mires, CNM  07/13/2021 10:11 PM

## 2021-07-15 LAB — STREP GP B CULTURE+RFLX: Strep Gp B Culture+Rflx: NEGATIVE

## 2021-07-18 ENCOUNTER — Other Ambulatory Visit: Payer: Self-pay

## 2021-07-18 ENCOUNTER — Observation Stay
Admission: EM | Admit: 2021-07-18 | Discharge: 2021-07-18 | Disposition: A | Discharge status: Home / Self Care | Payer: BC Managed Care – PPO | Attending: Obstetrics and Gynecology | Admitting: Obstetrics and Gynecology

## 2021-07-18 ENCOUNTER — Telehealth: Payer: Self-pay

## 2021-07-18 ENCOUNTER — Encounter: Payer: Self-pay | Admitting: Obstetrics and Gynecology

## 2021-07-18 DIAGNOSIS — O479 False labor, unspecified: Secondary | ICD-10-CM | POA: Diagnosis present

## 2021-07-18 DIAGNOSIS — J45909 Unspecified asthma, uncomplicated: Secondary | ICD-10-CM | POA: Insufficient documentation

## 2021-07-18 DIAGNOSIS — Z3403 Encounter for supervision of normal first pregnancy, third trimester: Secondary | ICD-10-CM

## 2021-07-18 DIAGNOSIS — O99513 Diseases of the respiratory system complicating pregnancy, third trimester: Secondary | ICD-10-CM | POA: Diagnosis not present

## 2021-07-18 DIAGNOSIS — O4703 False labor before 37 completed weeks of gestation, third trimester: Secondary | ICD-10-CM | POA: Diagnosis present

## 2021-07-18 DIAGNOSIS — Z3A36 36 weeks gestation of pregnancy: Secondary | ICD-10-CM

## 2021-07-18 DIAGNOSIS — Z79899 Other long term (current) drug therapy: Secondary | ICD-10-CM | POA: Insufficient documentation

## 2021-07-18 LAB — URINALYSIS, COMPLETE (UACMP) WITH MICROSCOPIC
Bilirubin Urine: NEGATIVE
Glucose, UA: NEGATIVE mg/dL
Hgb urine dipstick: NEGATIVE
Ketones, ur: NEGATIVE mg/dL
Nitrite: NEGATIVE
Protein, ur: NEGATIVE mg/dL
Specific Gravity, Urine: 1.003 — ABNORMAL LOW (ref 1.005–1.030)
pH: 7 (ref 5.0–8.0)

## 2021-07-18 MED ORDER — CYCLOBENZAPRINE HCL 10 MG PO TABS: 10.0000 mg | ORAL_TABLET | Freq: Every day | ORAL | 0 refills | Status: DC

## 2021-07-18 NOTE — OB Triage Note (Signed)
RN and CNM went over discharge paperwork and prescriptions with patient and support person. Patient verbalized understanding and all questions answered.

## 2021-07-18 NOTE — OB Triage Note (Signed)
Patient G1P0 [redacted]w[redacted]d presents to L&D for complains of contractions that started about 1000-1100. Patient reports feeling nauseous since contractions started. Patient states she has had leaking of fluid since this morning. She states that she has not had a gush of fluid, but has had clear/white liquid that has soaked her panties. Patient reports positive fetal movement, but states that it has been slower today. VSS. Monitors applied and assessing.

## 2021-07-18 NOTE — Telephone Encounter (Signed)
Pt calling; is in constant pain in back and stomach area; nausea.  [phone]  Pt states she is having ctxs q89m today.  Adv to go to L&D via ED.  Heather notified.

## 2021-07-18 NOTE — Discharge Summary (Signed)
Final Progress Note  Patient ID: Alexis Bolton MRN: [id number] DOB/AGE: [birth year] 25 y.o.  Admit date: 07/18/2021 Admitting provider: Natale Milch, MD Discharge date: 07/18/2021   Admission Diagnoses: irregular uterine contractions  Discharge Diagnoses:  Active Problems:   Irregular uterine contractions  Reactive reassuring fetal heart tones Uterine irritability  History of Present Illness: The patient is a 25 y.o. female G1P0 at [redacted]w[redacted]d who presents for some abdominal pain and concern that she may be in labor. She has described her pain as continual and located in her upper abdomen and to a certain extent in her back.  She denies any vaginal bleeding; does report that she had intercourse over the weekend. She reports having some vaginal discharge, but is not wearing a pad or anything to absorb any active LOF.she called the [facility] office and was advised to go to the hospital.  Past Medical History:  Diagnosis Date   Asthma    Eczema     Past Surgical History:  Procedure Laterality Date   NO PAST SURGERIES      No current facility-administered medications on file prior to encounter.   Current Outpatient Medications on File Prior to Encounter  Medication Sig Dispense Refill   acetaminophen (TYLENOL) 325 MG tablet Take 650 mg by mouth every 6 (six) hours as needed.     azelastine (ASTELIN) 0.1 % nasal spray 1-2 PUFF IN EACH NOSTRIL TWICE A DAY NASALLY 90     fluticasone (FLONASE) 50 MCG/ACT nasal spray 1-2 SPRAY IN EACH NOSTRIL ONCE A DAY NASALLY 90     levocetirizine (XYZAL) 5 MG tablet SMARTSIG:1 Tablet(s) By Mouth Every Evening     montelukast (SINGULAIR) 10 MG tablet Take 1 tablet by mouth at bedtime.     Prenatal Vit-Fe Fumarate-FA (MULTIVITAMIN-PRENATAL) 27-0.8 MG TABS tablet Take 1 tablet by mouth daily at 12 noon.     EPINEPHrine 0.3 mg/0.3 mL IJ SOAJ injection See admin instructions.      Allergies  Allergen Reactions   Other     Nuts, spring mix causes  itchy throat   Penicillins    Pomegranate [Punica]     Itchy throat    Social History   Socioeconomic History   Marital status: Single    Spouse name: Not on file   Number of children: Not on file   Years of education: Not on file   Highest education level: Not on file  Occupational History   Not on file  Tobacco Use   Smoking status: Never   Smokeless tobacco: Never  Vaping Use   Vaping Use: Not on file  Substance and Sexual Activity   Alcohol use: No    Alcohol/week: 0.0 standard drinks   Drug use: No   Sexual activity: Yes    Birth control/protection: None  Other Topics Concern   Not on file  Social History Narrative   Lives with mom, dad and sister   Social Determinants of Health   Financial Resource Strain: Not on file  Food Insecurity: Not on file  Transportation Needs: Not on file  Physical Activity: Not on file  Stress: Not on file  Social Connections: Not on file  Intimate Partner Violence: Not on file    History reviewed. No pertinent family history.   Review of Systems  Constitutional: Negative.   HENT: Negative.    Eyes: Negative.   Cardiovascular: Negative.   Gastrointestinal: Negative.   Genitourinary: Negative.   Musculoskeletal: Negative.   Skin: Negative.   Neurological:  Negative.   Endo/Heme/Allergies: Negative.   Psychiatric/Behavioral: Negative.    All other systems reviewed and are negative.   Physical Exam: BP 135/86 (BP Location: Right Arm)   Pulse 98   Temp 98.9 F (37.2 C) (Oral)   Resp 20   Ht 5\' 2"  (1.575 m)   Wt 62.6 kg   LMP 11/02/2020 (Exact Date)   BMI 25.24 kg/m   Physical Exam Constitutional:      Appearance: Normal appearance.  Genitourinary:     Vulva and rectum normal.     Genitourinary Comments: Abdomen and fundus palpate soft. Speculum shows now LOF or pooling, scan t leukkorhea. VE; closed/50%/high  HENT:     Head: Normocephalic and atraumatic.  Cardiovascular:     Rate and Rhythm: Normal rate and  regular rhythm.     Pulses: Normal pulses.     Heart sounds: Normal heart sounds.  Pulmonary:     Effort: Pulmonary effort is normal.     Breath sounds: Normal breath sounds.  Abdominal:     Palpations: Abdomen is soft.     Comments: Gravid abdomen  Musculoskeletal:        General: Normal range of motion.     Cervical back: Normal range of motion and neck supple.  Neurological:     General: No focal deficit present.     Mental Status: She is alert and oriented to person, place, and time.  Skin:    General: Skin is warm and dry.  Psychiatric:        Mood and Affect: Mood normal.        Behavior: Behavior normal.  Vitals reviewed.    Consults: None  Significant Findings/ Diagnostic Studies: labs: Urinalysis pending  Procedures: EFM NST Baseline FHR: 145 baseline beats/min Variability: moderate Accelerations: present Decelerations: absent Tocometry: uterine irritability pattern recognized. Abdomen palpates soft. Rare contraction  Interpretation:  INDICATIONS: rule out uterine contractions RESULTS:  A NST procedure was performed with FHR monitoring and a normal baseline established, appropriate time of 20-40 minutes of evaluation, and accels >2 seen w 15x15 characteristics.  Results show a REACTIVE NST.    Hospital Course: The patient was admitted to Labor and Delivery Triage for observation. She was monitored and FHTs found to be reactive and reassuring. A sterile speculum was used to observe no pooling of fluid or vaginal leaking. A cervical exam reveals a closed cervix, that is505 effaced nd the baby is high.  With this reassurance, sheis discharged home with a limited amount of flexeril to address some of her ongoing back strain. Labor precautions presented.  Discharge Condition: good  Disposition:  Discharge disposition: 01-Home or Self Care      Diet: Regular diet  Discharge Activity: Activity as tolerated  Discharge Instructions     Discharge activity:  No  Restrictions   Complete by: As directed    Discharge diet:  No restrictions   Complete by: As directed    Fetal Kick Count:  Lie on our left side for one hour after a meal, and count the number of times your baby kicks.  If it is less than 5 times, get up, move around and drink some juice.  Repeat the test 30 minutes later.  If it is still less than 5 kicks in an hour, notify your doctor.   Complete by: As directed    LABOR:  When conractions begin, you should start to time them from the beginning of one contraction to the beginning  of  the next.  When contractions are 5 - 10 minutes apart or less and have been regular for at least an hour, you should call your health care provider.   Complete by: As directed    Notify physician for bleeding from the vagina   Complete by: As directed    Notify physician for blurring of vision or spots before the eyes   Complete by: As directed    Notify physician for chills or fever   Complete by: As directed    Notify physician for fainting spells, "black outs" or loss of consciousness   Complete by: As directed    Notify physician for increase in vaginal discharge   Complete by: As directed    Notify physician for leaking of fluid   Complete by: As directed    Notify physician for pain or burning when urinating   Complete by: As directed    Notify physician for pelvic pressure (sudden increase)   Complete by: As directed    Notify physician for severe or continued nausea or vomiting   Complete by: As directed    Notify physician for sudden gushing of fluid from the vagina (with or without continued leaking)   Complete by: As directed    Notify physician for sudden, constant, or occasional abdominal pain   Complete by: As directed    Notify physician if baby moving less than usual   Complete by: As directed       Allergies as of 07/18/2021       Reactions   Other    Nuts, spring mix causes itchy throat   Penicillins    Pomegranate [punica]     Itchy throat        Medication List     TAKE these medications    acetaminophen 325 MG tablet Commonly known as: TYLENOL Take 650 mg by mouth every 6 (six) hours as needed.   azelastine 0.1 % nasal spray Commonly known as: ASTELIN 1-2 PUFF IN EACH NOSTRIL TWICE A DAY NASALLY 90   cyclobenzaprine 10 MG tablet Commonly known as: FLEXERIL Take 1 tablet (10 mg total) by mouth at bedtime.   EPINEPHrine 0.3 mg/0.3 mL Soaj injection Commonly known as: EPI-PEN See admin instructions.   fluticasone 50 MCG/ACT nasal spray Commonly known as: FLONASE 1-2 SPRAY IN EACH NOSTRIL ONCE A DAY NASALLY 90   levocetirizine 5 MG tablet Commonly known as: XYZAL SMARTSIG:1 Tablet(s) By Mouth Every Evening   montelukast 10 MG tablet Commonly known as: SINGULAIR Take 1 tablet by mouth at bedtime.   multivitamin-prenatal 27-0.8 MG Tabs tablet Take 1 tablet by mouth daily at 12 noon.         Total time spent taking care of this patient: 35 minutes  Signed: Mirna Mires, CNM  07/18/2021, 4:05 PM

## 2021-07-20 ENCOUNTER — Encounter: Payer: Self-pay | Admitting: Obstetrics and Gynecology

## 2021-07-20 ENCOUNTER — Other Ambulatory Visit: Payer: Self-pay

## 2021-07-20 ENCOUNTER — Ambulatory Visit (INDEPENDENT_AMBULATORY_CARE_PROVIDER_SITE_OTHER): Payer: BC Managed Care – PPO | Admitting: Obstetrics and Gynecology

## 2021-07-20 VITALS — BP 120/70 | Ht 62.0 in | Wt 142.0 lb

## 2021-07-20 DIAGNOSIS — Z3A37 37 weeks gestation of pregnancy: Secondary | ICD-10-CM

## 2021-07-20 DIAGNOSIS — N3001 Acute cystitis with hematuria: Secondary | ICD-10-CM

## 2021-07-20 DIAGNOSIS — Z3403 Encounter for supervision of normal first pregnancy, third trimester: Secondary | ICD-10-CM

## 2021-07-20 DIAGNOSIS — O26843 Uterine size-date discrepancy, third trimester: Secondary | ICD-10-CM

## 2021-07-20 DIAGNOSIS — O99891 Other specified diseases and conditions complicating pregnancy: Secondary | ICD-10-CM

## 2021-07-20 DIAGNOSIS — M549 Dorsalgia, unspecified: Secondary | ICD-10-CM

## 2021-07-20 LAB — POCT URINALYSIS DIPSTICK
Bilirubin, UA: NEGATIVE
Glucose, UA: NEGATIVE
Nitrite, UA: NEGATIVE
Protein, UA: NEGATIVE
Spec Grav, UA: 1.015 (ref 1.010–1.025)
Urobilinogen, UA: NEGATIVE E.U./dL — AB
pH, UA: 5.5 (ref 5.0–8.0)

## 2021-07-20 MED ORDER — NITROFURANTOIN MONOHYD MACRO 100 MG PO CAPS
100.0000 mg | ORAL_CAPSULE | Freq: Two times a day (BID) | ORAL | 0 refills | Status: AC
Start: 2021-07-20 — End: 2021-07-25

## 2021-07-20 NOTE — Progress Notes (Signed)
Routine Prenatal Care Visit  Subjective  Alexis Bolton is a 25 y.o. G1P0 at [redacted]w[redacted]d being seen today for ongoing prenatal care.  She is currently monitored for the following issues for this low-risk pregnancy and has Exercise-induced asthma; Seasonal allergic rhinitis; Positive pregnancy test; Supervision of normal first pregnancy; Acute knee pain; Decreased fetal movement; [redacted] weeks gestation of pregnancy; and Irregular uterine contractions on their problem list.  ----------------------------------------------------------------------------------- Patient reports backache.   Contractions: Irregular. Vag. Bleeding: None.  Movement: Present. Denies leaking of fluid.  ----------------------------------------------------------------------------------- The following portions of the patient's history were reviewed and updated as appropriate: allergies, current medications, past family history, past medical history, past social history, past surgical history and problem list. Problem list updated.   Objective  Blood pressure 120/70, height 5\' 2"  (1.575 m), weight 142 lb (64.4 kg), last menstrual period 11/02/2020. Pregravid weight 100 lb (45.4 kg) Total Weight Gain 42 lb (19.1 kg) Urinalysis:      Fetal Status: Fetal Heart Rate (bpm): 160 Fundal Height: 32 cm Movement: Present     General:  Alert, oriented and cooperative. Patient is in no acute distress.  Skin: Skin is warm and dry. No rash noted.   Cardiovascular: Normal heart rate noted  Respiratory: Normal respiratory effort, no problems with respiration noted  Abdomen: Soft, gravid, appropriate for gestational age. Pain/Pressure: Present     Pelvic:  Cervical exam deferred        Extremities: Normal range of motion.  Edema: None  Mental Status: Normal mood and affect. Normal behavior. Normal judgment and thought content.     Assessment   25 y.o. G1P0 at [redacted]w[redacted]d by  08/09/2021, by Last Menstrual Period presenting for routine prenatal  visit  Plan   pregnancy1  Problems (from 11/02/20 to present)     Problem Noted Resolved   Supervision of normal first pregnancy 02/24/2021 by 02/26/2021, CNM No   Overview Addendum 07/15/2021 10:18 AM by 09/14/2021, CNM     Nursing Staff Provider  Office Location  [facility] Dating   LMP = 11 wk Mirna Mires  Language  English Anatomy US  Complete  Flu Vaccine   Genetic Screen  NIPS: Normal XX  TDaP vaccine   06/02/21 Hgb A1C or  GTT Early : Third trimester :   Covid unvaccinated   LAB RESULTS   Rhogam  Not needed Blood Type   A+  Feeding Plan Breast Antibody  negative  Contraception pill Rubella   NI  Circumcision  RPR NON-REACTIVE (09/21 0912)   Pediatrician   HBsAg   nonreactive  Support Person  Adam HIV NON-REACTIVE (09/21 03-13-1984)  Prenatal Classes Discussed Varicella NI    GBS  (For PCN allergy, check sensitivities) GBS negative  BTL Consent     VBAC Consent  Pap  2021 NIL    Hgb Electro      CF      SMA                   Growth 2022 is planned for this Friday.  Urine culture today Back pain seems to be SI join related.   Gestational age appropriate obstetric precautions including but not limited to vaginal bleeding, contractions, leaking of fluid and fetal movement were reviewed in detail with the patient.    Return in about 1 week (around 07/27/2021) for ROB weekly for 3 weeks.  07/29/2021 MD [facility], [facility] 07/20/2021, 10:00 AM

## 2021-07-20 NOTE — Patient Instructions (Signed)
BSacroiliac Joint Dysfunction  Sacroiliac joint dysfunction is a condition that causes inflammation on one or both sides of the sacroiliac (SI) joint. The SI joint is the joint between two bones of the pelvis called the sacrum and the ilium. The sacrum is the bone at the base of the spine. The ilium is the large bone that forms the hip. This condition causes deep aching or burning pain in the low back. In some cases,the pain may also spread into one or both buttocks, hips, or thighs. What are the causes? This condition may be caused by: Pregnancy. During pregnancy, extra stress is put on the SI joints because the pelvis widens. Injury, such as: Injuries from car crashes. Sports-related injuries. Work-related injuries. Having one leg that is shorter than the other. Conditions that affect the joints, such as: Rheumatoid arthritis. Gout. Psoriatic arthritis. Joint infection (septic arthritis). Sometimes, the cause of SI joint dysfunction is not known. What are the signs or symptoms? Symptoms of this condition include: Aching or burning pain in the lower back. The pain may also spread to other areas, such as: Buttocks. Groin. Thighs. Muscle spasms in or around the painful areas. Increased pain when standing, walking, running, stair climbing, bending, or lifting. How is this diagnosed? This condition is diagnosed with a physical exam and your medical history. During the exam, the health care provider may move one or both of your legs to different positions to check for pain. Various tests may be done to confirm the diagnosis, including: Imaging tests to look for other causes of pain. These may include: MRI. CT scan. Bone scan. Diagnostic injection. A numbing medicine is injected into the SI joint using a needle. If your pain is temporarily improved or stopped after the injection, this can indicate that SI joint dysfunction is the problem. How is this treated? Treatment depends on the  cause and severity of your condition. Treatment options can be noninvasive and may include: Ice or heat applied to the lower back area after an injury. This may help reduce pain and muscle spasms. Medicines to relieve pain or inflammation or to relax the muscles. Wearing a back brace (sacroiliac brace) to help support the joint while your back is healing. Physical therapy to increase muscle strength around the joint and flexibility at the joint. This may also involve learning proper body positions and ways of moving to relieve stress on the joint. Direct manipulation of the SI joint. Use of a device that provides electrical stimulation to help reduce pain at the joint. Other treatments may include: Injections of steroid medicine into the joint to reduce pain and swelling. Radiofrequency ablation. This treatment uses heat to burn away nerves that are carrying pain messages from the joint. Surgery to put in screws and plates that limit or prevent joint motion. This is rare. Follow these instructions at home: Medicines Take over-the-counter and prescription medicines only as told by your health care provider. Ask your health care provider if the medicine prescribed to you: Requires you to avoid driving or using machinery. Can cause constipation. You may need to take these actions to prevent or treat constipation: Drink enough fluid to keep your urine pale yellow. Take over-the-counter or prescription medicines. Eat foods that are high in fiber, such as beans, whole grains, and fresh fruits and vegetables. Limit foods that are high in fat and processed sugars, such as fried or sweet foods. If you have a brace: Wear the brace as told by your health care provider. Remove  it only as told by your health care provider. Keep the brace clean. If the brace is not waterproof: Do not let it get wet. Cover it with a watertight covering when you take a bath or a shower. Managing pain, stiffness, and  swelling     Icing can help with pain and swelling. Heat may help with muscle tension or spasms. Ask your health care provider if you should use ice or heat. If directed, put ice on the affected area: If you have a removable brace, remove it as told by your health care provider. Put ice in a plastic bag. Place a towel between your skin and the bag. Leave the ice on for 20 minutes, 2-3 times a day. Remove the ice if your skin turns bright red. This is very important. If you cannot feel pain, heat, or cold, you have a greater risk of damage to the area. If directed, apply heat to the affected area as often as told by your health care provider. Use the heat source that your health care provider recommends, such as a moist heat pack or a heating pad. Place a towel between your skin and the heat source. Leave the heat on for 20-30 minutes. Remove the heat if your skin turns bright red. This is especially important if you are unable to feel pain, heat, or cold. You may have a greater risk of getting burned. General instructions Rest as needed. Return to your normal activities as told by your health care provider. Ask your health care provider what activities are safe for you. Do exercises as told by your health care provider or physical therapist. Keep all follow-up visits. This is important. Contact a health care provider if: Your pain is not controlled with medicine. You have a fever. Your pain is getting worse. Get help right away if: You have weakness, numbness, or tingling in your legs or feet. You lose control of your bladder or bowels. Summary Sacroiliac (SI) joint dysfunction is a condition that causes inflammation on one or both sides of the SI joint. This condition causes deep aching or burning pain in the low back. In some cases, the pain may also spread into one or both buttocks, hips, or thighs. Treatment depends on the cause and severity of your condition. It may include  medicines to reduce pain and swelling or to relax muscles. This information is not intended to replace advice given to you by your health care provider. Make sure you discuss any questions you have with your healthcare provider. Document Revised: 04/01/2020 Document Reviewed: 04/01/2020 Elsevier Patient Education  2022 [employer]  ack Exercises These exercises help to make your trunk and back strong. They also help to keep the lower back flexible. Doing these exercises can help to prevent back pain or lessen existing pain. If you have back pain, try to do these exercises 2-3 times each day or as told by your doctor. As you get better, do the exercises once each day. Repeat the exercises more often as told by your doctor. To stop back pain from coming back, do the exercises once each day, or as told by your doctor. Exercises Single knee to chest Do these steps 3-5 times in a row for each leg: Lie on your back on a firm bed or the floor with your legs stretched out. Bring one knee to your chest. Grab your knee or thigh with both hands and hold them it in place. Pull on your knee until you  feel a gentle stretch in your lower back or buttocks. Keep doing the stretch for 10-30 seconds. Slowly let go of your leg and straighten it. Pelvic tilt Do these steps 5-10 times in a row: Lie on your back on a firm bed or the floor with your legs stretched out. Bend your knees so they point up to the ceiling. Your feet should be flat on the floor. Tighten your lower belly (abdomen) muscles to press your lower back against the floor. This will make your tailbone point up to the ceiling instead of pointing down to your feet or the floor. Stay in this position for 5-10 seconds while you gently tighten your muscles and breathe evenly. Cat-cow Do these steps until your lower back bends more easily: Get on your hands and knees on a firm surface. Keep your hands under your shoulders, and keep your knees under  your hips. You may put padding under your knees. Let your head hang down toward your chest. Tighten (contract) the muscles in your belly. Point your tailbone toward the floor so your lower back becomes rounded like the back of a cat. Stay in this position for 5 seconds. Slowly lift your head. Let the muscles of your belly relax. Point your tailbone up toward the ceiling so your back forms a sagging arch like the back of a cow. Stay in this position for 5 seconds.  Press-ups Do these steps 5-10 times in a row: Lie on your belly (face-down) on the floor. Place your hands near your head, about shoulder-width apart. While you keep your back relaxed and keep your hips on the floor, slowly straighten your arms to raise the top half of your body and lift your shoulders. Do not use your back muscles. You may change where you place your hands in order to make yourself more comfortable. Stay in this position for 5 seconds. Slowly return to lying flat on the floor.  Bridges Do these steps 10 times in a row: Lie on your back on a firm surface. Bend your knees so they point up to the ceiling. Your feet should be flat on the floor. Your arms should be flat at your sides, next to your body. Tighten your butt muscles and lift your butt off the floor until your waist is almost as high as your knees. If you do not feel the muscles working in your butt and the back of your thighs, slide your feet 1-2 inches farther away from your butt. Stay in this position for 3-5 seconds. Slowly lower your butt to the floor, and let your butt muscles relax. If this exercise is too easy, try doing it with your arms crossed over yourchest. Belly crunches Do these steps 5-10 times in a row: Lie on your back on a firm bed or the floor with your legs stretched out. Bend your knees so they point up to the ceiling. Your feet should be flat on the floor. Cross your arms over your chest. Tip your chin a little bit toward your chest  but do not bend your neck. Tighten your belly muscles and slowly raise your chest just enough to lift your shoulder blades a tiny bit off of the floor. Avoid raising your body higher than that, because it can put too much stress on your low back. Slowly lower your chest and your head to the floor. Back lifts Do these steps 5-10 times in a row: Lie on your belly (face-down) with your arms at your  sides, and rest your forehead on the floor. Tighten the muscles in your legs and your butt. Slowly lift your chest off of the floor while you keep your hips on the floor. Keep the back of your head in line with the curve in your back. Look at the floor while you do this. Stay in this position for 3-5 seconds. Slowly lower your chest and your face to the floor. Contact a doctor if: Your back pain gets a lot worse when you do an exercise. Your back pain does not get better 2 hours after you exercise. If you have any of these problems, stop doing the exercises. Do not do them again unless your doctor says it is okay. Get help right away if: You have sudden, very bad back pain. If this happens, stop doing the exercises. Do not do them again unless your doctor says it is okay. This information is not intended to replace advice given to you by your health care provider. Make sure you discuss any questions you have with your healthcare provider. Document Revised: 08/15/2018 Document Reviewed: 08/15/2018 Elsevier Patient Education  2022 [employer].

## 2021-07-22 ENCOUNTER — Other Ambulatory Visit: Payer: Self-pay

## 2021-07-22 ENCOUNTER — Ambulatory Visit
Admission: RE | Admit: 2021-07-22 | Discharge: 2021-07-22 | Disposition: A | Discharge status: Home / Self Care | Payer: BC Managed Care – PPO | Source: Ambulatory Visit | Attending: Obstetrics and Gynecology | Admitting: Obstetrics and Gynecology

## 2021-07-22 DIAGNOSIS — O26843 Uterine size-date discrepancy, third trimester: Secondary | ICD-10-CM

## 2021-07-22 LAB — URINE CULTURE

## 2021-07-25 ENCOUNTER — Ambulatory Visit (INDEPENDENT_AMBULATORY_CARE_PROVIDER_SITE_OTHER): Payer: BC Managed Care – PPO | Admitting: Advanced Practice Midwife

## 2021-07-25 ENCOUNTER — Other Ambulatory Visit: Payer: Self-pay

## 2021-07-25 ENCOUNTER — Encounter: Payer: Self-pay | Admitting: Advanced Practice Midwife

## 2021-07-25 VITALS — BP 120/80 | Wt 141.0 lb

## 2021-07-25 DIAGNOSIS — Z3A37 37 weeks gestation of pregnancy: Secondary | ICD-10-CM

## 2021-07-25 DIAGNOSIS — Z3403 Encounter for supervision of normal first pregnancy, third trimester: Secondary | ICD-10-CM

## 2021-07-25 NOTE — Progress Notes (Signed)
  Routine Prenatal Care Visit  Subjective  Alexis Bolton is a 25 y.o. G1P0 at [redacted]w[redacted]d being seen today for ongoing prenatal care.  She is currently monitored for the following issues for this low-risk pregnancy and has Seasonal allergic rhinitis; Positive pregnancy test; Supervision of normal first pregnancy; Acute knee pain; Decreased fetal movement; [redacted] weeks gestation of pregnancy; and Irregular uterine contractions on their problem list.  ----------------------------------------------------------------------------------- Patient reports having some contractions and irregular cramping/back pain.   Contractions: Irregular. Vag. Bleeding: None.  Movement: Present. Leaking Fluid denies.  ----------------------------------------------------------------------------------- The following portions of the patient's history were reviewed and updated as appropriate: allergies, current medications, past family history, past medical history, past social history, past surgical history and problem list. Problem list updated.  Objective  Blood pressure 120/80, weight 141 lb (64 kg), last menstrual period 11/02/2020. Pregravid weight 100 lb (45.4 kg) Total Weight Gain 41 lb (18.6 kg) Urinalysis: Urine Protein    Urine Glucose    Fetal Status: Fetal Heart Rate (bpm): 157 Fundal Height: 37 cm Movement: Present     Growth scan on 8/19: 50%  General:  Alert, oriented and cooperative. Patient is in no acute distress.  Skin: Skin is warm and dry. No rash noted.   Cardiovascular: Normal heart rate noted  Respiratory: Normal respiratory effort, no problems with respiration noted  Abdomen: Soft, gravid, appropriate for gestational age. Pain/Pressure: Present     Pelvic:  Cervical exam deferred        Extremities: Normal range of motion.  Edema: None  Mental Status: Normal mood and affect. Normal behavior. Normal judgment and thought content.   Assessment   25 y.o. G1P0 at [redacted]w[redacted]d by  08/09/2021, by Last Menstrual  Period presenting for routine prenatal visit  Plan   pregnancy1  Problems (from 11/02/20 to present)    Problem Noted Resolved   Supervision of normal first pregnancy 02/24/2021 by Tresea Mall, CNM No   Overview Addendum 07/20/2021 10:02 AM by Natale Milch, MD     Nursing Staff Provider  Office Location  [facility] Dating   LMP = 11 wk [place]  Language  English Anatomy US  Complete  Flu Vaccine   Genetic Screen  NIPS: Normal XX  TDaP vaccine   06/02/21 Hgb A1C or  GTT Third trimester : 123  Covid unvaccinated   LAB RESULTS   Rhogam  Not needed Blood Type   A+  Feeding Plan Breast Antibody  negative  Contraception pill Rubella   NI  Circumcision  RPR NON-REACTIVE (09/21 0912)   Pediatrician   HBsAg   nonreactive  Support Person  Adam HIV NON-REACTIVE (09/21 9233)  Prenatal Classes Discussed Varicella NI    GBS  (For PCN allergy, check sensitivities) GBS negative  BTL Consent     VBAC Consent  Pap  2021 NIL    Hgb Electro      CF      SMA                   Term labor symptoms and general obstetric precautions including but not limited to vaginal bleeding, contractions, leaking of fluid and fetal movement were reviewed in detail with the patient.    Return for scheduled prenatal visit.  Tresea Mall, CNM 07/25/2021 11:56 AM

## 2021-08-03 ENCOUNTER — Other Ambulatory Visit: Payer: Self-pay

## 2021-08-03 ENCOUNTER — Encounter: Payer: Self-pay | Admitting: Obstetrics & Gynecology

## 2021-08-03 ENCOUNTER — Ambulatory Visit (INDEPENDENT_AMBULATORY_CARE_PROVIDER_SITE_OTHER): Payer: BC Managed Care – PPO | Admitting: Obstetrics & Gynecology

## 2021-08-03 VITALS — BP 120/80 | Wt 144.0 lb

## 2021-08-03 DIAGNOSIS — Z3403 Encounter for supervision of normal first pregnancy, third trimester: Secondary | ICD-10-CM

## 2021-08-03 DIAGNOSIS — Z3A39 39 weeks gestation of pregnancy: Secondary | ICD-10-CM

## 2021-08-03 LAB — POCT URINALYSIS DIPSTICK OB: Glucose, UA: NEGATIVE

## 2021-08-03 NOTE — Progress Notes (Deleted)
  The note originally documented on this encounter has been moved the the encounter in which it belongs.  

## 2021-08-03 NOTE — Patient Instructions (Signed)
First Stage of Labor Labor is your body's natural process of moving your baby and other structures, including the placenta and umbilical cord, out of your uterus. There are three stages of labor. How long each stage lasts is different for every woman. But certain events happen during each stage that are the same for everyone. The first stage starts when true labor begins. This stage ends when your cervix, which is the opening from your uterus into your vagina, is completely open (dilated). The second stage begins when your cervix is fully dilated and you start pushing. This stage ends when your baby is born. The third stage is the delivery of the organ that nourished your baby during pregnancy (placenta). First stage of labor As your due date gets closer, you may start to notice certain physical changes that mean labor is going to start soon. You may feel that your baby has dropped lower into your pelvis. You may experience irregular, often painless, contractions that go away when you walk around or lie down ([employer] contractions). This is also called false labor. The first stage of labor begins when you start having contractions that come at regular (evenly spaced) intervals and your cervix starts to get thinner and wider in preparation for your baby to pass through. Birth care providers measure the dilation of your cervix in centimeters (cm). One centimeter is a little less than one-half of an inch. The first stage ends when your cervix is dilated to 10 cm. The first stage of labor is divided into three phases: Early phase. Active phase. Transitional phase. The length of the first stage of labor varies. It may be longer if this is your first pregnancy. You may spend most of this stage at home trying to relax and stay comfortable. How does this affect me? During the first stage of labor, you will move through three phases. What happens in the early phase? You will start to have regular  contractions that last 30-60 seconds. Contractions may come every 5-20 minutes. Keep track of your contractions and call your birth care provider. Your water may break during this phase. You may notice a clear or slightly bloody discharge of mucus (mucus plug) from your vagina. Your cervix will dilate to 3-6 cm. What happens in the active phase? The active phase usually lasts 3-5 hours. You may go to the hospital or birth center around this time. During the active phase: Your contractions will become stronger, longer, and more uncomfortable. Your contractions may last 45-90 seconds and come every 3-5 minutes. You may feel lower back pain. Your birth care providers may examine your cervix and feel your belly to find the position of your baby. You may have a monitor strapped to your belly to measure your contractions and your baby's heart rate. You may start using your pain management options. Your cervix may be dilated to 6 cm and may start to dilate more quickly. What happens in the transitional phase? The transitional phase typically lasts from 30 minutes to 2 hours. At the end of this phase, your cervix will be fully dilated to 10 cm. During the transitional phase: Contractions will get stronger and longer. Contractions may last 60-90 seconds and come less than 2 minutes apart. You may feel hot flashes, chills, or nausea. How does this affect my baby? During the first stage of labor, your baby will gradually move down into your birth canal. Follow these instructions at home and in the hospital or birth center:  When labor first begins, try to stay calm. You are still in the early phase. If it is night, try to get some sleep. If it is day, try to relax and save your energy. You may want to make some calls and get ready to go to the hospital or birth center. When you are in the early phase, try these methods to help ease discomfort: Deep breathing and muscle relaxation. Taking a  walk. Taking a warm bath or shower. Drink some fluids and have a light snack if you feel like it. Keep track of your contractions. Based on the plan you created with your birth care provider, call when your contractions indicate it is time. If your water breaks, note the time, color, and odor of the fluid. When you are in the active phase, do your breathing exercises and rely on your support people and your team of birth care providers. Contact a health care provider if: Your contractions are strong and regular. You have lower back pain or cramping. Your water breaks. You lose your mucus plug. Get help right away if you: Have a severe headache that does not go away. Have changes in your vision. Have severe pain in your upper belly. Do not feel the baby move. Have bright red bleeding. Summary The first stage of labor starts when true labor begins, and it ends when your cervix is dilated to 10 cm. The first stage of labor has three phases: early, active, and transitional. Your baby moves into the birth canal during the first stage of labor. You may have contractions that become stronger and longer. You may also lose your mucus plug and have your water break. Call your birth care provider when your contractions are frequent and strong enough to go to the hospital or birth center. This information is not intended to replace advice given to you by your health care provider. Make sure you discuss any questions you have with your health care provider. Document Revised: 03/13/2019 Document Reviewed: 02/03/2018 Elsevier Patient Education  2022 Elsevier Inc.  

## 2021-08-03 NOTE — Progress Notes (Signed)
  [facility] INDUCTION ASSESSMENT SCHEDULING Alexis Bolton 07-26-1996 Medical record #: [id number] Phone #:  Home Phone [phone]  Mobile [phone]    Prenatal Provider:[facility] Delivering Group:[facility] Proposed admission date/time:08/04/21 at 0500 Method of induction:Cytotec  Weight: Filed Weights08/31/22 0948Weight:144 lb (65.3 kg) BMI Body mass index is 26.34 kg/m. HIV Negative HSV Negative EDC Estimated Date of Delivery: 9/6/22based on:LMP  Gestational age on admission: 39 2/7 Gravidity/parity:G1P0  Cervix Score   0 1 2 3   Position   Anterior   Consistency   Soft   Effacement (%)    >80  Dilation (cm)  1-2    Baby's station  -2     Bishop Score:9   Medical induction of labor  select indication(s) below Elective induction ?39 weeks primiparous patient with Bishop score ?7  Provider Signature: Scheduled Alexis Bolton Date:08/03/2021 10:09 AM   Call [phone] to finalize the induction date/time  [phone] (07/17)

## 2021-08-03 NOTE — H&P (Signed)
History and Physical  Alexis Bolton is a 25 y.o. G1P0 [redacted]w[redacted]d for Induction of Labor scheduled due to Favorable cervix at term .   See labor record for pregnancy highlights.  No recent pain, bleeding, ruptured membranes, or other signs of progressing labor.  PMHx: She  has a past medical history of Asthma and Eczema. Also,  has a past surgical history that includes No past surgeries., family history is not on file.,  reports that she has never smoked. She has never used smokeless tobacco. She reports that she does not drink alcohol and does not use drugs. She has a current medication list which includes the following prescription(s): acetaminophen, azelastine, cyclobenzaprine, epinephrine, fluticasone, levocetirizine, montelukast, and multivitamin-prenatal. Also, is allergic to other, penicillins, and pomegranate [punica]. OB History  Gravida Para Term Preterm AB Living  1            SAB IAB Ectopic Multiple Live Births               # Outcome Date GA Lbr Len/2nd Weight Sex Delivery Anes PTL Lv  1 Current           Patient denies any other pertinent gynecologic issues.   Review of Systems  Constitutional:  Negative for chills, fever and malaise/fatigue.  HENT:  Negative for congestion, sinus pain and sore throat.   Eyes:  Negative for blurred vision and pain.  Respiratory:  Negative for cough and wheezing.   Cardiovascular:  Negative for chest pain and leg swelling.  Gastrointestinal:  Negative for abdominal pain, constipation, diarrhea, heartburn, nausea and vomiting.  Genitourinary:  Negative for dysuria, frequency, hematuria and urgency.  Musculoskeletal:  Negative for back pain, joint pain, myalgias and neck pain.  Skin:  Negative for itching and rash.  Neurological:  Negative for dizziness, tremors and weakness.  Endo/Heme/Allergies:  Does not bruise/bleed easily.  Psychiatric/Behavioral:  Negative for depression. The patient is not nervous/anxious and does not have insomnia.     Objective: BP 120/80   Wt 144 lb (65.3 kg)   LMP 11/02/2020 (Exact Date)   BMI 26.34 kg/m  Physical Exam Constitutional:      General: She is not in acute distress.    Appearance: She is well-developed.  Genitourinary:     Bladder, rectum and urethral meatus normal.     No lesions in the vagina.     Right Labia: No rash, tenderness or lesions.    Left Labia: No tenderness, lesions or rash.    No vaginal bleeding.      Right Adnexa: not tender and no mass present.    Left Adnexa: not tender and no mass present.    No cervical motion tenderness, friability, lesion or polyp.     Cervical exam comments: FT/80/-2 VTX Soft/ant.     Uterus is not enlarged.     No uterine mass detected.    Pelvic exam was performed with patient in the lithotomy position.  Breasts:    Right: No mass, skin change or tenderness.     Left: No mass, skin change or tenderness.  HENT:     Head: Normocephalic and atraumatic. No laceration.     Right Ear: Hearing normal.     Left Ear: Hearing normal.     Mouth/Throat:     Pharynx: Uvula midline.  Eyes:     Pupils: Pupils are equal, round, and reactive to light.  Neck:     Thyroid: No thyromegaly.  Cardiovascular:  Rate and Rhythm: Normal rate and regular rhythm.     Heart sounds: No murmur heard.   No friction rub. No gallop.  Pulmonary:     Effort: Pulmonary effort is normal. No respiratory distress.     Breath sounds: Normal breath sounds. No wheezing.  Abdominal:     General: Bowel sounds are normal. There is no distension.     Palpations: Abdomen is soft.     Tenderness: There is no abdominal tenderness. There is no rebound.     Comments: FHT 140s VTX  Musculoskeletal:        General: Normal range of motion.     Cervical back: Normal range of motion and neck supple.  Neurological:     Mental Status: She is alert and oriented to person, place, and time.     Cranial Nerves: No cranial nerve deficit.  Skin:    General: Skin is warm  and dry.  Psychiatric:        Judgment: Judgment normal.  Vitals reviewed.    Assessment: Term Pregnancy for Induction of Labor due to Favorable cervix at term.  Plan: Patient will undergo induction of labor with cervical ripening agents.     Patient has been fully informed of the pros and cons, risks and benefits of continued observation with fetal monitoring versus that of induction of labor.   She understands that there are uncommon risks to induction, which include but are not limited to : frequent or prolonged uterine contractions, fetal distress, uterine rupture, and lack of successful induction.  These risks include all methods including Pitocin and Misoprostol and Cervadil.  Patient understands that using Misoprostol for labor induction is an "off label" indication although it has been studied extensively for this purpose and is an accepted method of induction.  She also has been informed of the increased risks for Cesarean with induction and should induction not be successful.  Patient consents to the induction plan of management.  Plans to breast feed Plans oral progesterone-only contraceptive for contraception TDaP UTD6/30/22 Needs pp MMR and Varivax  Barnett Applebaum, MD, [facility], [facility] 08/03/2021  10:15 AM

## 2021-08-04 ENCOUNTER — Other Ambulatory Visit: Payer: Self-pay

## 2021-08-04 ENCOUNTER — Inpatient Hospital Stay: Payer: BC Managed Care – PPO | Admitting: Anesthesiology

## 2021-08-04 ENCOUNTER — Encounter: Payer: Self-pay | Admitting: Obstetrics and Gynecology

## 2021-08-04 ENCOUNTER — Inpatient Hospital Stay
Admission: EM | Admit: 2021-08-04 | Discharge: 2021-08-06 | DRG: 806 | Disposition: A | Discharge status: Home / Self Care | Payer: BC Managed Care – PPO | Attending: Obstetrics & Gynecology | Admitting: Obstetrics & Gynecology

## 2021-08-04 DIAGNOSIS — Z3A39 39 weeks gestation of pregnancy: Secondary | ICD-10-CM | POA: Diagnosis not present

## 2021-08-04 DIAGNOSIS — O9081 Anemia of the puerperium: Secondary | ICD-10-CM | POA: Diagnosis not present

## 2021-08-04 DIAGNOSIS — Z20822 Contact with and (suspected) exposure to covid-19: Secondary | ICD-10-CM | POA: Diagnosis present

## 2021-08-04 DIAGNOSIS — D62 Acute posthemorrhagic anemia: Secondary | ICD-10-CM | POA: Diagnosis not present

## 2021-08-04 DIAGNOSIS — Z3400 Encounter for supervision of normal first pregnancy, unspecified trimester: Secondary | ICD-10-CM

## 2021-08-04 DIAGNOSIS — Z3403 Encounter for supervision of normal first pregnancy, third trimester: Secondary | ICD-10-CM

## 2021-08-04 DIAGNOSIS — O26893 Other specified pregnancy related conditions, third trimester: Secondary | ICD-10-CM | POA: Diagnosis present

## 2021-08-04 LAB — CBC
HCT: 29.9 % — ABNORMAL LOW (ref 36.0–46.0)
Hemoglobin: 9.9 g/dL — ABNORMAL LOW (ref 12.0–15.0)
MCH: 27.6 pg (ref 26.0–34.0)
MCHC: 33.1 g/dL (ref 30.0–36.0)
MCV: 83.3 fL (ref 80.0–100.0)
Platelets: 249 10*3/uL (ref 150–400)
RBC: 3.59 MIL/uL — ABNORMAL LOW (ref 3.87–5.11)
RDW: 14 % (ref 11.5–15.5)
WBC: 6.8 10*3/uL (ref 4.0–10.5)
nRBC: 0 % (ref 0.0–0.2)

## 2021-08-04 LAB — RESP PANEL BY RT-PCR (FLU A&B, COVID) ARPGX2
Influenza A by PCR: NEGATIVE
Influenza B by PCR: NEGATIVE
SARS Coronavirus 2 by RT PCR: NEGATIVE

## 2021-08-04 LAB — ABO/RH: ABO/RH(D): A POS

## 2021-08-04 LAB — TYPE AND SCREEN
ABO/RH(D): A POS
Antibody Screen: NEGATIVE

## 2021-08-04 LAB — RPR: RPR Ser Ql: NONREACTIVE

## 2021-08-04 MED ORDER — BUTORPHANOL TARTRATE 1 MG/ML IJ SOLN
1.0000 mg | INTRAMUSCULAR | Status: DC | PRN
Start: 2021-08-04 — End: 2021-08-05
  Administered 2021-08-04: 1 mg via INTRAVENOUS
  Filled 2021-08-04: qty 1

## 2021-08-04 MED ORDER — OXYTOCIN-SODIUM CHLORIDE 30-0.9 UT/500ML-% IV SOLN
2.5000 [IU]/h | INTRAVENOUS | Status: DC
  Administered 2021-08-05: 2.5 [IU]/h via INTRAVENOUS
  Filled 2021-08-04 (×2): qty 500

## 2021-08-04 MED ORDER — LACTATED RINGERS IV SOLN
500.0000 mL | INTRAVENOUS | Status: DC | PRN
Start: 2021-08-04 — End: 2021-08-05
  Administered 2021-08-04: 500 mL via INTRAVENOUS

## 2021-08-04 MED ORDER — PHENYLEPHRINE 40 MCG/ML (10ML) SYRINGE FOR IV PUSH (FOR BLOOD PRESSURE SUPPORT)
80.0000 ug | PREFILLED_SYRINGE | INTRAVENOUS | Status: DC | PRN
Start: 2021-08-04 — End: 2021-08-05
  Filled 2021-08-04: qty 10

## 2021-08-04 MED ORDER — TERBUTALINE SULFATE 1 MG/ML IJ SOLN: 0.2500 mg | Freq: Once | INTRAMUSCULAR | Status: DC | PRN

## 2021-08-04 MED ORDER — ACETAMINOPHEN 325 MG PO TABS: 650.0000 mg | ORAL_TABLET | ORAL | Status: DC | PRN

## 2021-08-04 MED ORDER — SODIUM CHLORIDE 0.9 % IV SOLN
INTRAVENOUS | Status: DC | PRN
  Administered 2021-08-04 (×2): 5 mL via EPIDURAL

## 2021-08-04 MED ORDER — LIDOCAINE HCL (PF) 1 % IJ SOLN
30.0000 mL | INTRAMUSCULAR | Status: DC | PRN
  Filled 2021-08-04: qty 30

## 2021-08-04 MED ORDER — LIDOCAINE-EPINEPHRINE (PF) 1.5 %-1:200000 IJ SOLN
INTRAMUSCULAR | Status: DC | PRN
  Administered 2021-08-04: 3 mL via EPIDURAL

## 2021-08-04 MED ORDER — ONDANSETRON HCL 4 MG/2ML IJ SOLN: 4.0000 mg | Freq: Four times a day (QID) | INTRAMUSCULAR | Status: DC | PRN

## 2021-08-04 MED ORDER — DIPHENHYDRAMINE HCL 50 MG/ML IJ SOLN
INTRAMUSCULAR | Status: AC
  Filled 2021-08-04: qty 1

## 2021-08-04 MED ORDER — FENTANYL-BUPIVACAINE-NACL 0.5-0.125-0.9 MG/250ML-% EP SOLN
12.0000 mL/h | EPIDURAL | Status: DC | PRN
  Administered 2021-08-04: 12 mL/h via EPIDURAL

## 2021-08-04 MED ORDER — EPHEDRINE 5 MG/ML INJ
10.0000 mg | INTRAVENOUS | Status: DC | PRN
  Filled 2021-08-04: qty 2

## 2021-08-04 MED ORDER — OXYTOCIN-SODIUM CHLORIDE 30-0.9 UT/500ML-% IV SOLN
1.0000 m[IU]/min | INTRAVENOUS | Status: DC
  Administered 2021-08-04: 1 m[IU]/min via INTRAVENOUS

## 2021-08-04 MED ORDER — METHYLERGONOVINE MALEATE 0.2 MG/ML IJ SOLN
INTRAMUSCULAR | Status: AC
  Filled 2021-08-04: qty 1

## 2021-08-04 MED ORDER — DIPHENHYDRAMINE HCL 50 MG/ML IJ SOLN: 12.5000 mg | INTRAMUSCULAR | Status: DC | PRN

## 2021-08-04 MED ORDER — MISOPROSTOL 25 MCG QUARTER TABLET
25.0000 ug | ORAL_TABLET | ORAL | Status: DC | PRN
  Administered 2021-08-04: 25 ug via VAGINAL
  Filled 2021-08-04 (×2): qty 1

## 2021-08-04 MED ORDER — LACTATED RINGERS IV SOLN: 500.0000 mL | Freq: Once | INTRAVENOUS | Status: DC

## 2021-08-04 MED ORDER — LIDOCAINE HCL (PF) 1 % IJ SOLN
INTRAMUSCULAR | Status: DC | PRN
  Administered 2021-08-04: 3 mL via SUBCUTANEOUS

## 2021-08-04 MED ORDER — FENTANYL-BUPIVACAINE-NACL 0.5-0.125-0.9 MG/250ML-% EP SOLN
EPIDURAL | Status: AC
  Filled 2021-08-04: qty 250

## 2021-08-04 MED ORDER — OXYTOCIN BOLUS FROM INFUSION
333.0000 mL | Freq: Once | INTRAVENOUS | Status: AC
  Administered 2021-08-05: 333 mL via INTRAVENOUS

## 2021-08-04 MED ORDER — LACTATED RINGERS IV SOLN: INTRAVENOUS | Status: DC

## 2021-08-04 NOTE — H&P (Signed)
History and Physical Interval Note:  08/04/2021 8:01 AM  Alexis Bolton  has presented today for INDUCTION OF LABOR (cervical ripening agents),  with the diagnosis of Favorable cervix at term. The various methods of treatment have been discussed with the patient and family. After consideration of risks, benefits and other options for treatment, the patient has consented to  Labor induction .  The patient's history has been reviewed, patient examined, no change in status, and is stable for induction as planned.  See H&P. I have reviewed the patient's chart and labs.  Questions were answered to the patient's satisfaction.    Plans to breast feed Plans oral progesterone-only contraceptive for contraception TDaP UTD6/30/22 Needs pp MMR and Varivax  Barnett Applebaum, MD, [facility], [facility] 08/04/2021  8:01 AM

## 2021-08-04 NOTE — Progress Notes (Signed)
  Labor Progress Note   25 y.o. G1P0 @ [redacted]w[redacted]d , admitted for  Pregnancy, Labor Management.   Subjective:  Mild pain  Objective:  BP 129/83 (BP Location: Left Arm)   Pulse 88   Temp 98.1 F (36.7 C) (Oral)   Resp 18   Ht 5\' 2"  (1.575 m)   Wt 65.3 kg   LMP 11/02/2020 (Exact Date)   BMI 26.34 kg/m  Abd: gravid, ND, FHT present, mild tenderness on exam Extr: trace to 1+ bilateral pedal edema SVE: CERVIX: 3 cm dilated, 80 effaced, -1 station   VTX   AROM CLEAR  EFM: FHR: 130 bpm, variability: moderate,  accelerations:  Present,  decelerations:  Absent Toco: Frequency: Every 2-3 minutes on Pitocin 17 mU/min Labs: I have reviewed the patient's lab results.   Assessment & Plan:  G1P0 @ [redacted]w[redacted]d, admitted for  Pregnancy and Labor/Delivery Management  1. Pain management: none. 2. FWB: FHT category 1.  3. ID: GBS negative 4. Labor management: AROM done. Cont Pitocin  All discussed with patient, see orders  [redacted]w[redacted]d, MD, [facility], [facility] 08/04/2021  3:24 PM

## 2021-08-04 NOTE — Progress Notes (Signed)
  Labor Progress Note   25 y.o. G1P0 @ [redacted]w[redacted]d , admitted for  Pregnancy, Labor Management.   Subjective:  Pressure  Objective:  BP 130/83   Pulse 89   Temp 97.6 F (36.4 C) (Oral)   Resp 18   Ht 5\' 2"  (1.575 m)   Wt 65.3 kg   LMP 11/02/2020 (Exact Date)   SpO2 100%   BMI 26.34 kg/m  Abd: gravid, ND, FHT present, mild tenderness on exam Extr: trace to 1+ bilateral pedal edema SVE: CERVIX: 10 cm dilated, 100 effaced, 0 station  EFM: FHR: 120 bpm, variability: moderate,  accelerations:  Present,  decelerations:  Present Early decels Toco: Frequency: Every 3-4 minutes Labs: I have reviewed the patient's lab results.   Assessment & Plan:  G1P0 @ [redacted]w[redacted]d, admitted for  Pregnancy and Labor/Delivery Management  1. Pain management: epidural. 2. FWB: FHT category 2.  3. ID: GBS negative 4. Labor management: Second stage now  All discussed with patient, see orders  [redacted]w[redacted]d, MD, Annam[facility] Major [facility]/G[facility], [facility] 08/04/2021  11:58 PM

## 2021-08-04 NOTE — Anesthesia Procedure Notes (Signed)
Epidural Patient location during procedure: OB Start time: 08/04/2021 10:49 PM End time: 08/04/2021 10:52 PM  Staffing Anesthesiologist: Lenard Simmer, MD Performed: anesthesiologist   Preanesthetic Checklist Completed: patient identified, IV checked, site marked, risks and benefits discussed, surgical consent, monitors and equipment checked, pre-op evaluation and timeout performed  Epidural Patient position: sitting Prep: ChloraPrep Patient monitoring: heart rate, continuous pulse ox and blood pressure Approach: midline Location: L3-L4 Injection technique: LOR saline  Needle:  Needle type: Tuohy  Needle gauge: 17 G Needle length: 9 cm and 9 Needle insertion depth: 4.5 cm Catheter type: closed end flexible Catheter size: 19 Gauge Catheter at skin depth: 9 cm Test dose: negative and 1.5% lidocaine with Epi 1:200 K  Assessment Sensory level: T10 Events: blood not aspirated, injection not painful, no injection resistance, no paresthesia and negative IV test  Additional Notes 1 attempt Pt. Evaluated and documentation done after procedure finished. Patient identified. Risks/Benefits/Options discussed with patient including but not limited to bleeding, infection, nerve damage, paralysis, failed block, incomplete pain control, headache, blood pressure changes, nausea, vomiting, reactions to medication both or allergic, itching and postpartum back pain. Confirmed with bedside nurse the patient's most recent platelet count. Confirmed with patient that they are not currently taking any anticoagulation, have any bleeding history or any family history of bleeding disorders. Patient expressed understanding and wished to proceed. All questions were answered. Sterile technique was used throughout the entire procedure. Please see nursing notes for vital signs. Test dose was given through epidural catheter and negative prior to continuing to dose epidural or start infusion. Warning signs of high  block given to the patient including shortness of breath, tingling/numbness in hands, complete motor block, or any concerning symptoms with instructions to call for help. Patient was given instructions on fall risk and not to get out of bed. All questions and concerns addressed with instructions to call with any issues or inadequate analgesia.    Patient tolerated the insertion well without immediate complications.Reason for block:procedure for pain

## 2021-08-04 NOTE — Anesthesia Preprocedure Evaluation (Signed)
Anesthesia Evaluation  Patient identified by MRN, date of birth, ID band Patient awake    Reviewed: Allergy & Precautions, H&P , NPO status , Patient's Chart, lab work & pertinent test results  History of Anesthesia Complications Negative for: history of anesthetic complications  Airway Mallampati: II       Dental no notable dental hx.    Pulmonary asthma ,           Cardiovascular      Neuro/Psych    GI/Hepatic GERD  Controlled,  Endo/Other    Renal/GU      Musculoskeletal   Abdominal   Peds  Hematology   Anesthesia Other Findings   Reproductive/Obstetrics (+) Pregnancy                             Anesthesia Physical Anesthesia Plan  ASA: 2  Anesthesia Plan: Epidural   Post-op Pain Management:    Induction:   PONV Risk Score and Plan:   Airway Management Planned:   Additional Equipment:   Intra-op Plan:   Post-operative Plan:   Informed Consent: I have reviewed the patients History and Physical, chart, labs and discussed the procedure including the risks, benefits and alternatives for the proposed anesthesia with the patient or authorized representative who has indicated his/her understanding and acceptance.       Plan Discussed with: Anesthesiologist and CRNA  Anesthesia Plan Comments:         Anesthesia Quick Evaluation

## 2021-08-04 NOTE — Progress Notes (Signed)
RN at bedside. Audible arrhythmia noted.

## 2021-08-04 NOTE — Progress Notes (Signed)
  Labor Progress Note   25 y.o. G1P0 @ [redacted]w[redacted]d , admitted for  Pregnancy, Labor Management.   Subjective:  Min pain  Objective:  BP 134/82 (BP Location: Left Arm)   Pulse 85   Temp 98 F (36.7 C) (Oral)   Resp 18   Ht 5\' 2"  (1.575 m)   Wt 65.3 kg   LMP 11/02/2020 (Exact Date)   BMI 26.34 kg/m  Abd: gravid, ND, FHT present, mild tenderness on exam Extr: trace to 1+ bilateral pedal edema SVE: CERVIX: 2 cm dilated, 80 effaced, -1 station  EFM: FHR: 130 bpm, variability: moderate,  accelerations:  Present,  decelerations:  Absent Toco: Frequency: Every 2-3 minutes Labs: I have reviewed the patient's lab results.   Assessment & Plan:  G1P0 @ [redacted]w[redacted]d, admitted for  Pregnancy and Labor/Delivery Management  1. Pain management: none. 2. FWB: FHT category 1.  3. ID: GBS negative 4. Labor management: One dose cytotec w cervical change and now reg ctxs Change to Pitocin AROM as appropriate  All discussed with patient, see orders  [redacted]w[redacted]d, MD, Annamarie Major Ob/G[facility], [facility] 08/04/2021  10:19 AM

## 2021-08-05 ENCOUNTER — Telehealth: Payer: Self-pay | Admitting: Family Medicine

## 2021-08-05 ENCOUNTER — Encounter: Payer: Self-pay | Admitting: Obstetrics & Gynecology

## 2021-08-05 DIAGNOSIS — Z3A39 39 weeks gestation of pregnancy: Secondary | ICD-10-CM

## 2021-08-05 LAB — CBC
HCT: 26.6 % — ABNORMAL LOW (ref 36.0–46.0)
Hemoglobin: 8.7 g/dL — ABNORMAL LOW (ref 12.0–15.0)
MCH: 27.7 pg (ref 26.0–34.0)
MCHC: 32.7 g/dL (ref 30.0–36.0)
MCV: 84.7 fL (ref 80.0–100.0)
Platelets: 217 10*3/uL (ref 150–400)
RBC: 3.14 MIL/uL — ABNORMAL LOW (ref 3.87–5.11)
RDW: 14 % (ref 11.5–15.5)
WBC: 13.8 10*3/uL — ABNORMAL HIGH (ref 4.0–10.5)
nRBC: 0 % (ref 0.0–0.2)

## 2021-08-05 MED ORDER — OXYCODONE-ACETAMINOPHEN 5-325 MG PO TABS
1.0000 | ORAL_TABLET | ORAL | Status: DC | PRN
Start: 2021-08-05 — End: 2021-08-06

## 2021-08-05 MED ORDER — OXYCODONE-ACETAMINOPHEN 5-325 MG PO TABS
2.0000 | ORAL_TABLET | ORAL | Status: DC | PRN
Start: 2021-08-05 — End: 2021-08-06

## 2021-08-05 MED ORDER — BENZOCAINE-MENTHOL 20-0.5 % EX AERO
1.0000 "application " | INHALATION_SPRAY | CUTANEOUS | Status: DC | PRN
  Filled 2021-08-05 (×2): qty 56

## 2021-08-05 MED ORDER — SENNOSIDES-DOCUSATE SODIUM 8.6-50 MG PO TABS
2.0000 | ORAL_TABLET | ORAL | Status: DC
  Administered 2021-08-05 – 2021-08-06 (×2): 2 via ORAL
  Filled 2021-08-05 (×2): qty 2

## 2021-08-05 MED ORDER — DIPHENHYDRAMINE HCL 25 MG PO CAPS: 25.0000 mg | ORAL_CAPSULE | Freq: Four times a day (QID) | ORAL | Status: DC | PRN

## 2021-08-05 MED ORDER — SENNOSIDES-DOCUSATE SODIUM 8.6-50 MG PO TABS: 2.0000 | ORAL_TABLET | ORAL | Status: DC

## 2021-08-05 MED ORDER — IBUPROFEN 600 MG PO TABS
600.0000 mg | ORAL_TABLET | Freq: Four times a day (QID) | ORAL | Status: DC
  Administered 2021-08-05 – 2021-08-06 (×5): 600 mg via ORAL
  Filled 2021-08-05 (×5): qty 1

## 2021-08-05 MED ORDER — WITCH HAZEL-GLYCERIN EX PADS
1.0000 "application " | MEDICATED_PAD | CUTANEOUS | Status: DC | PRN
  Filled 2021-08-05: qty 100

## 2021-08-05 MED ORDER — DIBUCAINE (PERIANAL) 1 % EX OINT
1.0000 "application " | TOPICAL_OINTMENT | CUTANEOUS | Status: DC | PRN
  Filled 2021-08-05 (×2): qty 28

## 2021-08-05 MED ORDER — ACETAMINOPHEN 325 MG PO TABS
650.0000 mg | ORAL_TABLET | ORAL | Status: DC | PRN
  Administered 2021-08-05 (×2): 650 mg via ORAL
  Filled 2021-08-05 (×2): qty 2

## 2021-08-05 MED ORDER — SODIUM CHLORIDE 0.9 % IV SOLN: 250.0000 mL | INTRAVENOUS | Status: DC | PRN

## 2021-08-05 MED ORDER — SODIUM CHLORIDE 0.9% FLUSH: 3.0000 mL | Freq: Two times a day (BID) | INTRAVENOUS | Status: DC

## 2021-08-05 MED ORDER — ZOLPIDEM TARTRATE 5 MG PO TABS: 5.0000 mg | ORAL_TABLET | Freq: Every evening | ORAL | Status: DC | PRN

## 2021-08-05 MED ORDER — COCONUT OIL OIL
1.0000 "application " | TOPICAL_OIL | Status: DC | PRN
  Filled 2021-08-05 (×2): qty 120

## 2021-08-05 MED ORDER — VARICELLA VIRUS VACCINE LIVE 1350 PFU/0.5ML IJ SUSR
0.5000 mL | INTRAMUSCULAR | Status: DC | PRN
  Filled 2021-08-05: qty 0.5

## 2021-08-05 MED ORDER — SIMETHICONE 80 MG PO CHEW: 80.0000 mg | CHEWABLE_TABLET | ORAL | Status: DC | PRN

## 2021-08-05 MED ORDER — SODIUM CHLORIDE 0.9% FLUSH: 3.0000 mL | INTRAVENOUS | Status: DC | PRN

## 2021-08-05 MED ORDER — ONDANSETRON HCL 4 MG/2ML IJ SOLN: 4.0000 mg | INTRAMUSCULAR | Status: DC | PRN

## 2021-08-05 MED ORDER — ONDANSETRON HCL 4 MG PO TABS: 4.0000 mg | ORAL_TABLET | ORAL | Status: DC | PRN

## 2021-08-05 NOTE — Progress Notes (Signed)
Post Partum Day 0 Subjective: no complaints, up ad lib, and tolerating PO She has not been able to void since delivery. Preparing for I and O catheter  Objective: Blood pressure 129/83, pulse 84, temperature 98.4 F (36.9 C), temperature source Oral, resp. rate 18, height 5\' 2"  (1.575 m), weight 65.3 kg, last menstrual period 11/02/2020, SpO2 99 %, unknown if currently breastfeeding.  Physical Exam:  General: cooperative, fatigued, and no distress Lochia: appropriate Uterine Fundus: firm Incision: healing well Some perineal edema. DVT Evaluation: No evidence of DVT seen on physical exam. Negative Homan's sign.  Recent Labs    08/04/21 0545 08/05/21 0726  HGB 9.9* 8.7*  HCT 29.9* 26.6*    Assessment/Plan: Plan for discharge tomorrow, Breastfeeding, and Lactation consult Continue Postpartum orders. CBC tomorrow AM   LOS: 1 day   10/05/21 08/05/2021, 9:55 AM

## 2021-08-05 NOTE — Telephone Encounter (Signed)
Pt called office to schedule appointment and asked for me. I was away from my desk at the time. I attempted to reach patient to schedule. Lvm asking her to give office a call.

## 2021-08-05 NOTE — Discharge Summary (Signed)
Postpartum Discharge Summary  Date of Service updated: 08/06/2021     Patient Name: Alexis Bolton DOB: January 21, 1996 MRN: [id number]  Date of admission: 08/04/2021 Delivery date:08/05/2021  Delivering provider: Gae Dry  Date of discharge: 08/06/2021  Admitting diagnosis: Normal labor and delivery [O80] Intrauterine pregnancy: [redacted]w[redacted]d    Secondary diagnosis:  Principal Problem:   Supervision of normal first pregnancy Active Problems:   Normal labor and delivery   [redacted] weeks gestation of pregnancy   Postpartum care following vaginal delivery   Encounter for care or examination of lactating mother   Vacuum Assisted Vaginal Delivery  Additional problems: None    Discharge diagnosis: Term Pregnancy Delivered                                              Post partum procedures: none Augmentation: AROM, Pitocin, and Cytotec Complications: None  Hospital course: Induction of Labor With Vaginal Delivery   25y.o. yo G1P0 at 23w3das admitted to the hospital 08/04/2021 for induction of labor.  Indication for induction: Favorable cervix at term.  Patient had an uncomplicated labor course as follows: Membrane Rupture Time/Date: 3:19 PM ,08/04/2021   Delivery Method:Vaginal, Vacuum (Extractor)  Episiotomy: None  Lacerations:  2nd degree;Perineal  - 2nd degree Details of delivery can be found in separate delivery note.  Patient had a routine postpartum course. Patient is discharged home 08/06/21.  Newborn Data: Birth date:08/05/2021  Birth time:2:03 AM  Gender:Female  Luna Living status:Living  Apgars:8 ,9  Weight:3190 g   Magnesium Sulfate received: No BMZ received: No Rhophylac:No MMR:No T-DaP:Given prenatally Flu: No Transfusion:No  Physical exam  Vitals:   08/05/21 1234 08/05/21 1627 08/06/21 0153 08/06/21 0746  BP: 125/87 130/84 128/90 126/82  Pulse: 91 87 81 87  Resp: 18 18 18 18   Temp: 98 F (36.7 C) 98.6 F (37 C) 98.6 F (37 C) 98.3 F (36.8 C)  TempSrc: Oral  Oral Oral Oral  SpO2: 100% 99%  99%  Weight:      Height:       General: alert, cooperative, and no distress Lochia: appropriate Uterine Fundus: firm Incision: N/A DVT Evaluation: No evidence of DVT seen on physical exam. Labs: Lab Results  Component Value Date   WBC 11.6 (H) 08/06/2021   HGB 7.8 (L) 08/06/2021   HCT 23.6 (L) 08/06/2021   MCV 82.8 08/06/2021   PLT 196 08/06/2021   CMP Latest Ref Rng & Units 08/24/2020  Glucose 70 - 99 mg/dL 81  BUN 6 - 23 mg/dL 12  Creatinine 0.40 - 1.20 mg/dL 0.75  Sodium 135 - 145 mEq/L 137  Potassium 3.5 - 5.1 mEq/L 3.9  Chloride 96 - 112 mEq/L 102  CO2 19 - 32 mEq/L 28  Calcium 8.4 - 10.5 mg/dL 9.7  Total Protein 6.0 - 8.3 g/dL 7.2  Total Bilirubin 0.2 - 1.2 mg/dL 0.7  Alkaline Phos 39 - 117 U/L 43  AST 0 - 37 U/L 11  ALT 0 - 35 U/L 8   Edinburgh Score: Edinburgh Postnatal Depression Scale Screening Tool 08/05/2021  I have been able to laugh and see the funny side of things. 0  I have looked forward with enjoyment to things. 0  I have blamed myself unnecessarily when things went wrong. 0  I have been anxious or worried for no good reason.  0  I have felt scared or panicky for no good reason. 0  Things have been getting on top of me. 0  I have been so unhappy that I have had difficulty sleeping. 0  I have felt sad or miserable. 0  I have been so unhappy that I have been crying. 0  The thought of harming myself has occurred to me. 0  Edinburgh Postnatal Depression Scale Total 0      After visit meds:  Allergies as of 08/06/2021       Reactions   Other    Nuts, spring mix causes itchy throat   Penicillins    Pomegranate [punica]    Itchy throat        Medication List     STOP taking these medications    cyclobenzaprine 10 MG tablet Commonly known as: FLEXERIL       TAKE these medications    acetaminophen 325 MG tablet Commonly known as: TYLENOL Take 650 mg by mouth every 6 (six) hours as needed.   azelastine  0.1 % nasal spray Commonly known as: ASTELIN 1-2 PUFF IN EACH NOSTRIL TWICE A DAY NASALLY 90   EPINEPHrine 0.3 mg/0.3 mL Soaj injection Commonly known as: EPI-PEN See admin instructions.   fluticasone 50 MCG/ACT nasal spray Commonly known as: FLONASE 1-2 SPRAY IN EACH NOSTRIL ONCE A DAY NASALLY 90   levocetirizine 5 MG tablet Commonly known as: XYZAL SMARTSIG:1 Tablet(s) By Mouth Every Evening   montelukast 10 MG tablet Commonly known as: SINGULAIR Take 1 tablet by mouth at bedtime.   multivitamin-prenatal 27-0.8 MG Tabs tablet Take 1 tablet by mouth daily at 12 noon.         Discharge home in stable condition Infant Feeding: Breast Infant Disposition:home with mother Discharge instruction: per After Visit Summary and Postpartum booklet. Activity: Advance as tolerated. Pelvic rest for 6 weeks.  Diet: routine diet Anticipated Birth Control: POPs Postpartum Appointment:6 weeks Additional Postpartum F/U:  none Future Appointments:No future appointments. Follow up Visit:  Follow-up Information     Gae Dry, MD. Schedule an appointment as soon as possible for a visit in 6 week(s).   Specialty: Obstetrics and Gynecology Why: For Post Partum Check Contact information: [address] [place] [address] [phone]                    08/06/2021 Rod Can, CNM

## 2021-08-05 NOTE — Lactation Note (Addendum)
This note was copied from a baby's chart. Lactation Consultation Note  Patient Name: Alexis Bolton [id number] Date: 08/05/2021 Reason for consult: Initial assessment;Primapara;Term Age:23 hours  Maternal Data Has patient been taught Hand Expression?: Yes Does the patient have breastfeeding experience prior to this delivery?: No  Feeding Mother's Current Feeding Choice: Breast Milk Baby cueing, after expressing colostrum on right breast, baby latched easily to breast in cradle hold with mom shown how to shape breast and support breast to get deep latch, shown how to stimulate baby if baby becomes sleepy, nursed 15 min on right, swallows heard, mom burped baby on lap, offered left breast, took 2-3 atttempts until baby coordinated tongue, then nursed wel with stimulation to stay awake x 10 min  LATCH Score Latch: Grasps breast easily, tongue down, lips flanged, rhythmical sucking.  Audible Swallowing: Spontaneous and intermittent  Type of Nipple: Everted at rest and after stimulation  Comfort (Breast/Nipple): Soft / non-tender  Hold (Positioning): Assistance needed to correctly position infant at breast and maintain latch.  LATCH Score: 9   Lactation Tools Discussed/Used LC name and no written on white board   Interventions Interventions: Breast feeding basics reviewed;Assisted with latch;Skin to skin;Breast massage;Hand express;Support pillows;Position options;Coconut oil;Education Mom given handouts on breastfeeding resources etc.   Discharge Pump: Personal WIC Program: No (plans on signing up)  Consult Status Consult Status: PRN    Dyann Kief 08/05/2021, 11:32 AM

## 2021-08-05 NOTE — Lactation Note (Signed)
This note was copied from a baby's chart. Lactation Consultation Note  Patient Name: Alexis Bolton [id number] Date: 08/05/2021 Reason for consult: Follow-up assessment;Primapara Age:23 hours  Maternal Data Has patient been taught Hand Expression?: Yes Does the patient have breastfeeding experience prior to this delivery?: No  Feeding Mother's Current Feeding Choice: Breast Milk Assisted mom in left side lying position with baby beside her, baby rooting, latched easily and is nursing well with occ stimulation.   LATCH Score Latch: Grasps breast easily, tongue down, lips flanged, rhythmical sucking.  Audible Swallowing: Spontaneous and intermittent  Type of Nipple: Everted at rest and after stimulation  Comfort (Breast/Nipple): Soft / non-tender  Hold (Positioning): Assistance needed to correctly position infant at breast and maintain latch.  LATCH Score: 9   Lactation Tools Discussed/Used    Interventions Interventions: Assisted with latch;Adjust position  Discharge Pump: Personal WIC Program: No (plans on signing up)  Consult Status Consult Status: PRN    Alexis Bolton 08/05/2021, 2:44 PM

## 2021-08-06 DIAGNOSIS — Z3A39 39 weeks gestation of pregnancy: Secondary | ICD-10-CM

## 2021-08-06 LAB — CBC WITH DIFFERENTIAL/PLATELET
Abs Immature Granulocytes: 0.15 10*3/uL — ABNORMAL HIGH (ref 0.00–0.07)
Basophils Absolute: 0 10*3/uL (ref 0.0–0.1)
Basophils Relative: 0 %
Eosinophils Absolute: 0.1 10*3/uL (ref 0.0–0.5)
Eosinophils Relative: 1 %
HCT: 23.6 % — ABNORMAL LOW (ref 36.0–46.0)
Hemoglobin: 7.8 g/dL — ABNORMAL LOW (ref 12.0–15.0)
Immature Granulocytes: 1 %
Lymphocytes Relative: 16 %
Lymphs Abs: 1.8 10*3/uL (ref 0.7–4.0)
MCH: 27.4 pg (ref 26.0–34.0)
MCHC: 33.1 g/dL (ref 30.0–36.0)
MCV: 82.8 fL (ref 80.0–100.0)
Monocytes Absolute: 0.7 10*3/uL (ref 0.1–1.0)
Monocytes Relative: 6 %
Neutro Abs: 8.9 10*3/uL — ABNORMAL HIGH (ref 1.7–7.7)
Neutrophils Relative %: 76 %
Platelets: 196 10*3/uL (ref 150–400)
RBC: 2.85 MIL/uL — ABNORMAL LOW (ref 3.87–5.11)
RDW: 14.3 % (ref 11.5–15.5)
WBC: 11.6 10*3/uL — ABNORMAL HIGH (ref 4.0–10.5)
nRBC: 0 % (ref 0.0–0.2)

## 2021-08-06 NOTE — Progress Notes (Signed)
Subjective:  Doing well postpartum day 1: she is tolerating regular diet, her pain is well controlled with PO medication, she is ambulating and now voiding without difficulty. She reports breastfeeding is going well and is using a nipple shield. She would like to discharge later today if newborn screens are all normal.  Objective:  Vital signs in last 24 hours: Temp:  [98 F (36.7 C)-98.6 F (37 C)] 98.3 F (36.8 C) (09/03 0746) Pulse Rate:  [81-91] 87 (09/03 0746) Resp:  [18] 18 (09/03 0746) BP: (125-130)/(82-90) 126/82 (09/03 0746) SpO2:  [99 %-100 %] 99 % (09/03 0746) Last BM Date: 08/03/21  General: NAD Pulmonary: no increased work of breathing Abdomen: non-distended, non-tender, fundus firm at level of umbilicus Extremities: no edema, no erythema, no tenderness  Results for orders placed or performed during the hospital encounter of 08/04/21 (from the past 72 hour(s))  CBC     Status: Abnormal   Collection Time: 08/04/21  5:45 AM  Result Value Ref Range   WBC 6.8 4.0 - 10.5 K/uL   RBC 3.59 (L) 3.87 - 5.11 MIL/uL   Hemoglobin 9.9 (L) 12.0 - 15.0 g/dL   HCT 58.5 (L) 27.7 - 82.4 %   MCV 83.3 80.0 - 100.0 fL   MCH 27.6 26.0 - 34.0 pg   MCHC 33.1 30.0 - 36.0 g/dL   RDW 23.5 36.1 - 44.3 %   Platelets 249 150 - 400 K/uL   nRBC 0.0 0.0 - 0.2 %    Comment: Performed at [facility], [address] [place], [place] [address]  Type and screen     Status: None   Collection Time: 08/04/21  5:45 AM  Result Value Ref Range   ABO/RH(D) A POS    Antibody Screen NEG    Sample Expiration      08/07/2021,2359 Performed at [facility], [address] [place], [place] [address]   RPR     Status: None   Collection Time: 08/04/21  5:45 AM  Result Value Ref Range   RPR Ser Ql NON REACTIVE NON REACTIVE    Comment: Performed at [facility], [address], [place], [place] [address]  Resp Panel by RT-PCR (Flu A&B, Covid) Nasopharyngeal Swab     Status:  None   Collection Time: 08/04/21  5:45 AM   Specimen: Nasopharyngeal Swab; Nasopharyngeal(NP) swabs in vial transport medium  Result Value Ref Range   SARS Coronavirus 2 by RT PCR NEGATIVE NEGATIVE    Comment: (NOTE) SARS-CoV-2 target nucleic acids are NOT DETECTED.  The SARS-CoV-2 RNA is generally detectable in upper respiratory specimens during the acute phase of infection. The lowest concentration of SARS-CoV-2 viral copies this assay can detect is 138 copies/mL. A negative result does not preclude SARS-Cov-2 infection and should not be used as the sole basis for treatment or other patient management decisions. A negative result may occur with  improper specimen collection/handling, submission of specimen other than nasopharyngeal swab, presence of viral mutation(s) within the areas targeted by this assay, and inadequate number of viral copies(<138 copies/mL). A negative result must be combined with clinical observations, patient history, and epidemiological information. The expected result is Negative.  Fact Sheet for Patients:  BloggerCourse.com  Fact Sheet for Healthcare Providers:  SeriousBroker.it  This test is no t yet approved or cleared by the [place] FDA and  has been authorized for detection and/or diagnosis of SARS-CoV-2 by FDA under an Emergency Use Authorization (EUA). This EUA  will remain  in effect (meaning this test can be used) for the duration of the COVID-19 declaration under Section 564(b)(1) of the Act, 21 U.S.C.section 360bbb-3(b)(1), unless the authorization is terminated  or revoked sooner.       Influenza A by PCR NEGATIVE NEGATIVE   Influenza B by PCR NEGATIVE NEGATIVE    Comment: (NOTE) The Xpert Xpress SARS-CoV-2/FLU/RSV plus assay is intended as an aid in the diagnosis of influenza from Nasopharyngeal swab specimens and should not be used as a sole basis for treatment. Nasal washings  and aspirates are unacceptable for Xpert Xpress SARS-CoV-2/FLU/RSV testing.  Fact Sheet for Patients: BloggerCourse.com  Fact Sheet for Healthcare Providers: SeriousBroker.it  This test is not yet approved or cleared by the [place] FDA and has been authorized for detection and/or diagnosis of SARS-CoV-2 by FDA under an Emergency Use Authorization (EUA). This EUA will remain in effect (meaning this test can be used) for the duration of the COVID-19 declaration under Section 564(b)(1) of the Act, 21 U.S.C. section 360bbb-3(b)(1), unless the authorization is terminated or revoked.  Performed at [facility], [address] [place], [place] [address]   ABO/Rh     Status: None   Collection Time: 08/04/21  7:12 AM  Result Value Ref Range   ABO/RH(D)      A POS Performed at [facility], [address] [place], [place] [address]   CBC     Status: Abnormal   Collection Time: 08/05/21  7:26 AM  Result Value Ref Range   WBC 13.8 (H) 4.0 - 10.5 K/uL   RBC 3.14 (L) 3.87 - 5.11 MIL/uL   Hemoglobin 8.7 (L) 12.0 - 15.0 g/dL   HCT 09.8 (L) 11.9 - 14.7 %   MCV 84.7 80.0 - 100.0 fL   MCH 27.7 26.0 - 34.0 pg   MCHC 32.7 30.0 - 36.0 g/dL   RDW 82.9 56.2 - 13.0 %   Platelets 217 150 - 400 K/uL   nRBC 0.0 0.0 - 0.2 %    Comment: Performed at [facility], [address] [place], [place] [address]  CBC with Differential/Platelet     Status: Abnormal   Collection Time: 08/06/21  7:14 AM  Result Value Ref Range   WBC 11.6 (H) 4.0 - 10.5 K/uL   RBC 2.85 (L) 3.87 - 5.11 MIL/uL   Hemoglobin 7.8 (L) 12.0 - 15.0 g/dL   HCT 46.9 (L) 62.9 - 52.8 %   MCV 82.8 80.0 - 100.0 fL   MCH 27.4 26.0 - 34.0 pg   MCHC 33.1 30.0 - 36.0 g/dL   RDW 41.3 24.4 - 01.0 %   Platelets 196 150 - 400 K/uL   nRBC 0.0 0.0 - 0.2 %   Neutrophils Relative % 76 %   Neutro Abs 8.9 (H) 1.7 - 7.7 K/uL   Lymphocytes Relative 16 %    Lymphs Abs 1.8 0.7 - 4.0 K/uL   Monocytes Relative 6 %   Monocytes Absolute 0.7 0.1 - 1.0 K/uL   Eosinophils Relative 1 %   Eosinophils Absolute 0.1 0.0 - 0.5 K/uL   Basophils Relative 0 %   Basophils Absolute 0.0 0.0 - 0.1 K/uL   Immature Granulocytes 1 %   Abs Immature Granulocytes 0.15 (H) 0.00 - 0.07 K/uL    Comment: Performed at [facility], [address], [place], [place] [address]    Assessment:   25 y.o. G1P1001 postpartum day # 1, lactating  Plan:  1) Acute blood loss anemia - hemodynamically stable and asymptomatic - po ferrous sulfate  2) Blood Type --/--/A POS Performed at [facility], [address] [place], [place] [address]  [phone]) / Ishmael Holter Immune (02/18 0000) / Varicella Non-Immune  3) TDAP status  given antepartum  4) Feeding plan breast  5)  Education given regarding options for contraception, as well as compatibility with breast feeding if applicable.  Patient plans on oral progesterone-only contraceptive for contraception.  6) Disposition: continue current care with possible D/C to home later today pending newborn screens   Tresea Mall, CNM [facility] 08/06/2021, 10:12 AM

## 2021-08-06 NOTE — Anesthesia Postprocedure Evaluation (Signed)
Anesthesia Post Note  Patient: Alexis Bolton  Procedure(s) Performed: AN AD HOC LABOR EPIDURAL  Patient location during evaluation: Mother Baby Anesthesia Type: Epidural Level of consciousness: awake and alert Pain management: pain level controlled Vital Signs Assessment: post-procedure vital signs reviewed and stable Respiratory status: spontaneous breathing, nonlabored ventilation and respiratory function stable Cardiovascular status: stable Postop Assessment: no headache, no backache and epidural receding Anesthetic complications: no   No notable events documented.   Last Vitals:  Vitals:   08/06/21 0153 08/06/21 0746  BP: 128/90 126/82  Pulse: 81 87  Resp: 18 18  Temp: 37 C 36.8 C  SpO2:  99%    Last Pain:  Vitals:   08/06/21 0900  TempSrc:   PainSc: 2                  Corinda Gubler

## 2021-08-06 NOTE — Progress Notes (Signed)
Patient discharged with infant. Discharge instructions, prescriptions, and follow up appointments given to and reviewed with patient. Patient verbalized understanding. Will be escorted out by axillary.  °

## 2021-08-10 ENCOUNTER — Encounter: Payer: BC Managed Care – PPO | Admitting: Obstetrics and Gynecology

## 2021-08-17 ENCOUNTER — Encounter: Payer: BC Managed Care – PPO | Admitting: Obstetrics

## 2021-09-15 ENCOUNTER — Other Ambulatory Visit (HOSPITAL_COMMUNITY)
Admission: RE | Admit: 2021-09-15 | Discharge: 2021-09-15 | Disposition: A | Discharge status: Home / Self Care | Payer: BC Managed Care – PPO | Source: Ambulatory Visit | Attending: Obstetrics & Gynecology | Admitting: Obstetrics & Gynecology

## 2021-09-15 ENCOUNTER — Other Ambulatory Visit: Payer: Self-pay

## 2021-09-15 ENCOUNTER — Encounter: Payer: Self-pay | Admitting: Obstetrics & Gynecology

## 2021-09-15 ENCOUNTER — Ambulatory Visit (INDEPENDENT_AMBULATORY_CARE_PROVIDER_SITE_OTHER): Payer: BC Managed Care – PPO | Admitting: Obstetrics & Gynecology

## 2021-09-15 DIAGNOSIS — Z124 Encounter for screening for malignant neoplasm of cervix: Secondary | ICD-10-CM | POA: Insufficient documentation

## 2021-09-15 DIAGNOSIS — Z30011 Encounter for initial prescription of contraceptive pills: Secondary | ICD-10-CM

## 2021-09-15 MED ORDER — NORETHINDRONE 0.35 MG PO TABS: 1.0000 | ORAL_TABLET | Freq: Every day | ORAL | 11 refills | Status: DC

## 2021-09-15 NOTE — Patient Instructions (Signed)
Norethindrone Tablets (Contraception) What is this medication? NORETHINDRONE (nor eth IN drone) prevents ovulation and pregnancy. It belongs to a group of medications called contraceptives. This medication is a progestinhormone. This medicine may be used for other purposes; ask your health care provider orpharmacist if you have questions. COMMON BRAND NAME(S): Camila, Deblitane 28-Day, Errin, Heather, Jencycla,Jolivette, Lyza, Nor-QD, Nora-BE, Norlyroc, Ortho Micronor, Sharobel 28-Day What should I tell my care team before I take this medication? They need to know if you have any of these conditions: Blood vessel disease or blood clots Breast, cervical, or vaginal cancer Diabetes Heart disease Kidney disease Liver disease Mental depression Migraine Seizures Stroke Vaginal bleeding An unusual or allergic reaction to norethindrone, other medications, foods, dyes, or preservatives Pregnant or trying to get pregnant Breast-feeding How should I use this medication? Take this medication by mouth with a glass of water. You may take it with or without food. Follow the directions on the prescription label. Take this medication at the same time each day and in the order directed on the package.Do not take your medication more often than directed. Contact your care team about the use of this medication in children. Special care may be needed. This medication has been used in female children who havestarted having menstrual periods. A patient package insert for the product will be given with each prescription and refill. Read this sheet carefully each time. The sheet may changefrequently. Overdosage: If you think you have taken too much of this medicine contact apoison control center or emergency room at once. NOTE: This medicine is only for you. Do not share this medicine with others. What if I miss a dose? Try not to miss a dose. Every time you miss a dose or take a dose late your chance of  pregnancy increases. When 1 pill is missed (even if only 3 hours late), take the missed pill as soon as possible and continue taking a pill each day at the regular time (use a back-up method of birth control for the next 48 hours). If more than 1 dose is missed, use an additional birth control method for the rest of your pill pack until menses occurs. Contact your care team ifmore than 1 dose has been missed. What may interact with this medication? Do not take this medication with any of the following: Amprenavir or fosamprenavir Bosentan This medication may also interact with the following: Antibiotics or medications for infections, especially rifampin, rifabutin, rifapentine, and griseofulvin, and possibly penicillins or tetracyclines Aprepitant Barbiturate medications, such as phenobarbital Carbamazepine Felbamate Modafinil Oxcarbazepine Phenytoin Ritonavir or other medications for HIV infection or AIDS St. John's wort Topiramate This list may not describe all possible interactions. Give your health care provider a list of all the medicines, herbs, non-prescription drugs, or dietary supplements you use. Also tell them if you smoke, drink alcohol, or use illegaldrugs. Some items may interact with your medicine. What should I watch for while using this medication? Visit your care team for regular checks on your progress. You will need aregular breast and pelvic exam and Pap smear while on this medication. Use an additional method of birth control during the first cycle that you takethese tablets. If you have any reason to think you are pregnant, stop taking this medicationright away and contact your care team. If you are taking this medication for hormone related problems, it may takeseveral cycles of use to see improvement in your condition. This medication does not protect you against HIV infection (AIDS) or any   othersexually transmitted diseases. What side effects may I notice from  receiving this medication? Side effects that you should report to your care team as soon as possible: Allergic reactions-skin rash, itching, hives, swelling of the face, lips, tongue, or throat Blood clot-pain, swelling, or warmth in the leg, shortness of breath, chest pain Gallbladder problems-severe stomach pain, nausea, vomiting, fever Increase in blood pressure Liver injury-right upper belly pain, loss of appetite, nausea, light-colored stool, dark yellow or brown urine, yellowing skin or eyes, unusual weakness or fatigue New or worsening migraines or headaches Stroke-sudden numbness or weakness of the face, arm, or leg, trouble speaking, confusion, trouble walking, loss of balance or coordination, dizziness, severe headache, change in vision Unusual vaginal discharge, itching, or odor Worsening mood, feelings of depression Side effects that usually do not require medical attention (report to your careteam if they continue or are bothersome): Breast pain or tenderness Dark patches of skin on the face or other sun-exposed areas Irregular menstrual cycles or spotting Nausea Weight gain This list may not describe all possible side effects. Call your doctor for medical advice about side effects. You may report side effects to FDA at1-800-FDA-1088. Where should I keep my medication? Keep out of the reach of children and pets. Store at room temperature between 15 and 30 degrees C (59 and 86 degrees F).Throw away any unused medication after the expiration date. NOTE: This sheet is a summary. It may not cover all possible information. If you have questions about this medicine, talk to your doctor, pharmacist, orhealth care provider.  2022 Elsevier/Gold Standard (2020-12-27 10:54:00)  

## 2021-09-15 NOTE — Progress Notes (Signed)
  OBSTETRICS POSTPARTUM CLINIC PROGRESS NOTE  Subjective:     Alexis Bolton is a 25 y.o. G26P1001 female who presents for a postpartum visit. She is 6 weeks postpartum following a Term pregnancy or Uncomplicated pregnancy and delivery by  VAVD .  I have fully reviewed the prenatal and intrapartum course. Anesthesia: epidural.  Postpartum course has been complicated by uncomplicated.  Baby is feeding by Breast.  Bleeding: patient has not  resumed menses.  Bowel function is normal. Bladder function is normal.  Patient is not sexually active. Contraception method desired is oral progesterone-only contraceptive.  Postpartum depression screening: negative. [place] 1.  The following portions of the patient's history were reviewed and updated as appropriate: allergies, current medications, past family history, past medical history, past social history, past surgical history, and problem list.  Review of Systems Pertinent items are noted in HPI.  Objective:    BP 120/80   Ht 5\' 1"  (1.549 m)   Wt 124 lb (56.2 kg)   LMP 09/11/2021   Breastfeeding Yes   BMI 23.43 kg/m   General:  alert and no distress   Breasts:  inspection negative, no nipple discharge or bleeding, no masses or nodularity palpable  Lungs: clear to auscultation bilaterally  Heart:  regular rate and rhythm, S1, S2 normal, no murmur, click, rub or gallop  Abdomen: soft, non-tender; bowel sounds normal; no masses,  no organomegaly.     Vulva:  normal  Vagina: normal vagina, no discharge, exudate, lesion, or erythema  Cervix:  no cervical motion tenderness and no lesions  Corpus: normal size, contour, position, consistency, mobility, non-tender  Adnexa:  normal adnexa and no mass, fullness, tenderness  Rectal Exam: Not performed.          Assessment:  Post Partum Care visit 1. Postpartum care and examination  2. Screening for cervical cancer - Cytology - PAP  3. Encounter for initial prescription of contraceptive  pills - Micronor while breast feeding - Counseled to change to OCP or other when stops breast feeding or when has periods again  Plan:  See orders and Patient Instructions Resume all normal activities Follow up in:  2  months or as needed.   11/11/2021, MD, [facility], [facility] 09/15/2021  10:54 AM

## 2021-09-16 LAB — CYTOLOGY - PAP: Diagnosis: NEGATIVE

## 2021-09-27 ENCOUNTER — Encounter: Payer: Self-pay | Admitting: Family Medicine

## 2021-10-14 ENCOUNTER — Other Ambulatory Visit: Payer: Self-pay

## 2021-10-14 ENCOUNTER — Encounter: Payer: Self-pay | Admitting: Emergency Medicine

## 2021-10-14 ENCOUNTER — Ambulatory Visit
Admission: EM | Admit: 2021-10-14 | Discharge: 2021-10-14 | Disposition: A | Discharge status: Home / Self Care | Payer: BC Managed Care – PPO | Attending: Emergency Medicine | Admitting: Emergency Medicine

## 2021-10-14 DIAGNOSIS — N390 Urinary tract infection, site not specified: Secondary | ICD-10-CM | POA: Diagnosis present

## 2021-10-14 LAB — POCT URINALYSIS DIP (MANUAL ENTRY)
Bilirubin, UA: NEGATIVE
Glucose, UA: NEGATIVE mg/dL
Ketones, POC UA: NEGATIVE mg/dL
Nitrite, UA: NEGATIVE
Protein Ur, POC: 100 mg/dL — AB
Spec Grav, UA: 1.025 (ref 1.010–1.025)
Urobilinogen, UA: 0.2 E.U./dL
pH, UA: 6 (ref 5.0–8.0)

## 2021-10-14 MED ORDER — CEPHALEXIN 500 MG PO CAPS: 500.0000 mg | ORAL_CAPSULE | Freq: Two times a day (BID) | ORAL | 0 refills | Status: AC

## 2021-10-14 NOTE — ED Provider Notes (Signed)
Subjective:    Alexis Bolton is a very pleasant 25 y.o. female who presents with concerns for UTI due to urinary urgency and frequency over the last 2 days. No unilateral back pain, vomiting, fever, vaginal discharge, concern for STD.  Past medical history, past surgical history, current medications reviewed.  Allergies: is allergic to other, penicillins, and pomegranate [punica].  Review of Systems See HPI   Objective:     Vitals:   10/14/21 0848  BP: 121/76  Pulse: (!) 111  Resp: 18  Temp: 98.6 F (37 C)  SpO2: 98%     General: Appears well-developed and well-nourished. No acute distress.  Cardiovascular: Normal rate  Pulm/Chest: No respiratory distress.  Abdominal: No CVAT.  Neurological: Alert and oriented to person, place, and time.  Skin: Skin is warm and dry.  Psychiatric: Normal mood, affect, behavior, and thought content.  GU:  Deferred secondary to self collect specimen  Laboratory:  Orders Placed This Encounter  Procedures   Urine Culture   POCT urinalysis dipstick   Results for orders placed or performed during the hospital encounter of 10/14/21  POCT urinalysis dipstick  Result Value Ref Range   Color, UA yellow yellow   Clarity, UA cloudy (A) clear   Glucose, UA negative negative mg/dL   Bilirubin, UA negative negative   Ketones, POC UA negative negative mg/dL   Spec Grav, UA 4.825 0.037 - 1.025   Blood, UA moderate (A) negative   pH, UA 6.0 5.0 - 8.0   Protein Ur, POC =100 (A) negative mg/dL   Urobilinogen, UA 0.2 0.2 or 1.0 E.U./dL   Nitrite, UA Negative Negative   Leukocytes, UA Small (1+) (A) Negative     Assessment:   1. Acute UTI - Urine Culture; Standing - Urine Culture - cephALEXin (KEFLEX) 500 MG capsule; Take 1 capsule (500 mg total) by mouth 2 (two) times daily for 7 days.  Dispense: 14 capsule; Refill: 0   Plan:   MDM: Patient presents with concerns for UTI due to urinary urgency and frequency over the last 2 days. No  unilateral back pain, vomiting, fever, vaginal discharge, concern for STD.  Urinalysis reveals cloudy urine, moderate blood, 100 protein and small leukocytes.  Urine culture pending.  Rx Keflex to the patient's preferred pharmacy to treat presumed acute UTI.  Advised about home treatment and care to include rest, fluids.  Return with any unilateral back pain, fever or vomiting.  Patient verbalized understanding and agreed with plan.  Patient stable upon discharge.  Return as needed.    Discharge Instructions      You were seen today for an infection in the lower urinary tract. Your urine sample today was sent for a culture. The urine culture will show what type of bacteria grows and if you are on the appropriate antibiotic. If the antibiotic needs to be changed, you will receive a phone call from the follow up nurse who will give you more information. If you do not receive a call, then you are on the correct antibiotic.  Take antibiotics as directed. Finish course even if feeling better sooner. Drink plenty of clear fluids. Over the counter "Uristat" or "Azo Standard" may help your discomfort.  This will turn your urine dark orange or red and may stain contacts (remove before using). You may experience 24 - 48 hours of continuing discomfort until medication controls the infection.  Return to clinic or go to the ER if you develop a fever, one-sided back pain,  or vomiting as these are signs of a worsening infection.          Amalia Greenhouse, [place] 10/14/21 [phone]

## 2021-10-14 NOTE — ED Triage Notes (Signed)
Pt here with increased urinary urgency and frequency x 2 days.

## 2021-10-14 NOTE — Discharge Instructions (Signed)

## 2021-10-16 LAB — URINE CULTURE
Culture: >=100000 — AB
Special Requests: NORMAL

## 2022-02-18 ENCOUNTER — Encounter: Payer: Self-pay | Admitting: Family Medicine

## 2022-02-20 NOTE — Telephone Encounter (Signed)
Spoke with patient mother made an appt for 02/21/21 @ 1230 pm  ?

## 2022-02-22 ENCOUNTER — Other Ambulatory Visit: Payer: Self-pay

## 2022-02-22 ENCOUNTER — Telehealth (INDEPENDENT_AMBULATORY_CARE_PROVIDER_SITE_OTHER): Payer: BC Managed Care – PPO | Admitting: Family Medicine

## 2022-02-22 ENCOUNTER — Other Ambulatory Visit: Payer: Self-pay | Admitting: Family Medicine

## 2022-02-22 ENCOUNTER — Encounter: Payer: Self-pay | Admitting: Family Medicine

## 2022-02-22 DIAGNOSIS — J029 Acute pharyngitis, unspecified: Secondary | ICD-10-CM

## 2022-02-22 LAB — POCT RAPID STREP A (OFFICE): Rapid Strep A Screen: NEGATIVE

## 2022-02-22 NOTE — Progress Notes (Signed)
Virtual Visit via Video Note ? ?I connected with Alexis Bolton on 02/22/22 at 12:30 PM EDT by a video enabled telemedicine application and verified that I am speaking with the correct person using two identifiers. ? ?Location: ?Patient: home ?Provider: office  ?  ?I discussed the limitations of evaluation and management by telemedicine and the availability of in person appointments. The patient expressed understanding and agreed to proceed. ? ?Parties involved in encounter ? ?Patient: Alexis Bolton ? ?Provider:  Loura Pardon MD  ? ?History of Present Illness: ?Pt presents with c/o ST ? ?Last night had irritated throat ?Worse this am  ?Voice sounded funny / a little hoarse  ?No nasal symptoms  ?A little cough here and there  ?Not severe  ?No chest tightness (just throat)  ? ?Has not looked in the mirror  ? ?No wheezing  ? ?No fever  ?Not feeling sick  ? ?Otc ?Allergy medicine  ?Not bad enough to take a tylenol  ? ? ?At home covid test is negative  ? ? 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  ?Patient Active Problem List  ? Diagnosis Date Noted  ? Sore throat 02/22/2022  ? [redacted] weeks gestation of pregnancy 08/06/2021  ? Postpartum care following vaginal delivery 08/06/2021  ? Encounter for care or examination of lactating mother 08/06/2021  ? Normal labor and delivery 08/04/2021  ? Acute knee pain 04/12/2021  ? Supervision of normal first pregnancy 02/24/2021  ? Seasonal allergic rhinitis 07/31/2016  ? ?Past Medical History:  ?Diagnosis Date  ? Asthma   ? Eczema   ? ?Past Surgical History:  ?Procedure Laterality Date  ? NO PAST SURGERIES    ? ?Social History  ? ?Tobacco Use  ? Smoking status: Never  ? Smokeless tobacco: Never  ?Substance Use Topics  ? Alcohol use: No  ?  Alcohol/week: 0.0 standard drinks  ? Drug use: No  ? ?Family History  ?Family history unknown: Yes  ? ?Allergies  ?Allergen Reactions  ? Other   ?  Nuts, spring mix causes itchy throat  ? Penicillins   ?  Pomegranate [[place]]   ?  Itchy throat  ? ?Current Outpatient Medications on File Prior to Visit  ?Medication Sig Dispense Refill  ? acetaminophen (TYLENOL) 325 MG tablet Take 650 mg by mouth every 6 (six) hours as needed.    ? azelastine (ASTELIN) 0.1 % nasal spray     ? EPINEPHrine 0.3 mg/0.3 mL IJ SOAJ injection See admin instructions.    ? fluticasone (FLONASE) 50 MCG/ACT nasal spray 1-2 SPRAY IN EACH NOSTRIL ONCE A DAY NASALLY 90    ? levocetirizine (XYZAL) 5 MG tablet SMARTSIG:1 Tablet(s) By Mouth Every Evening    ? montelukast (SINGULAIR) 10 MG tablet Take 1 tablet by mouth at bedtime.    ? norethindrone (MICRONOR) 0.35 MG tablet Take 1 tablet (0.35 mg total) by mouth daily. 28 tablet 11  ? Prenatal Vit-Fe Fumarate-FA (MULTIVITAMIN-PRENATAL) 27-0.8 MG TABS tablet Take 1 tablet by mouth daily at 12 noon.    ? ?No current facility-administered medications on file prior to visit.  ? Review of Systems  ?Constitutional:  Negative for chills, fever and malaise/fatigue.  ?HENT:  Positive for sore throat. Negative for congestion, ear pain and sinus pain.   ?Eyes:  Negative for blurred vision, discharge and redness.  ?Respiratory:  Positive for cough. Negative for sputum production, shortness of breath, wheezing and stridor.   ?  Cardiovascular:  Negative for chest pain, palpitations and leg swelling.  ?Gastrointestinal:  Negative for abdominal pain, diarrhea, nausea and vomiting.  ?Musculoskeletal:  Negative for myalgias.  ?Skin:  Negative for rash.  ?Neurological:  Negative for dizziness and headaches.   ?Observations/Objective: ?Patient appears well, in no distress ?Weight is baseline  ?No facial swelling or asymmetry ?Mildly hoarse voice  ?No obvious tremor or mobility impairment ?Moving neck and UEs normally ?Able to hear the call well  ?No cough or shortness of breath during interview , no audible wheezing  ?Talkative and mentally sharp with no cognitive changes ?No skin changes on face or neck , no rash or  pallor ?Affect is normal  ? ? ?Assessment and Plan: ?Problem List Items Addressed This Visit   ? ?  ? Other  ? Sore throat  ?  Mild to moderate with some hoarseness ?Scant cough  ?Diff incl early uri vs strep (neg covid test at home) ?Ordered strep screen-will come in for that  ? ?Discussed symptom control incl gargle/rest/tylenol /delsym ? ?Update if not starting to improve in a week or if worsening  ?ER parameters discussed  ?  ?  ? ? ? ?Follow Up Instructions: ?Drink fluids and rest  ?Rest your voice ?The office will call to set up a strep test ?Treat your symptoms ?Salt water gargles are helpful for sore throat along with Tylenol ?Delsym is good over-the-counter for cough as needed ? ?Update if not starting to improve in a week or if worsening   ?If any trouble breathing please go to the ER ?  ?I discussed the assessment and treatment plan with the patient. The patient was provided an opportunity to ask questions and all were answered. The patient agreed with the plan and demonstrated an understanding of the instructions. ?  ?The patient was advised to call back or seek an in-person evaluation if the symptoms worsen or if the condition fails to improve as anticipated. ? ? ? ? ?Loura Pardon, MD ? ?

## 2022-02-22 NOTE — Assessment & Plan Note (Signed)
Mild to moderate with some hoarseness ?Scant cough  ?Diff incl early uri vs strep (neg covid test at home) ?Ordered strep screen-will come in for that  ? ?Discussed symptom control incl gargle/rest/tylenol /delsym ? ?Update if not starting to improve in a week or if worsening  ?ER parameters discussed  ?

## 2022-02-22 NOTE — Patient Instructions (Signed)
Drink fluids and rest  ?Rest your voice ?The office will call to set up a strep test ?Treat your symptoms ?Salt water gargles are helpful for sore throat along with Tylenol ?Delsym is good over-the-counter for cough as needed ? ?Update if not starting to improve in a week or if worsening   ?If any trouble breathing please go to the ER ?

## 2022-03-13 ENCOUNTER — Encounter: Payer: Self-pay | Admitting: Family Medicine

## 2022-03-21 ENCOUNTER — Encounter: Payer: Self-pay | Admitting: Family Medicine

## 2022-03-21 ENCOUNTER — Ambulatory Visit (INDEPENDENT_AMBULATORY_CARE_PROVIDER_SITE_OTHER): Payer: BC Managed Care – PPO | Admitting: Family Medicine

## 2022-03-21 VITALS — BP 122/64 | HR 99 | Temp 98.0°F | Ht 61.0 in | Wt 122.2 lb

## 2022-03-21 DIAGNOSIS — F53 Postpartum depression: Secondary | ICD-10-CM | POA: Diagnosis not present

## 2022-03-21 DIAGNOSIS — Z309 Encounter for contraceptive management, unspecified: Secondary | ICD-10-CM | POA: Insufficient documentation

## 2022-03-21 DIAGNOSIS — Z30011 Encounter for initial prescription of contraceptive pills: Secondary | ICD-10-CM | POA: Diagnosis not present

## 2022-03-21 HISTORY — DX: Postpartum depression: F53.0

## 2022-03-21 MED ORDER — SERTRALINE HCL 50 MG PO TABS: ORAL_TABLET | ORAL | 3 refills | Status: DC

## 2022-03-21 MED ORDER — NORETHIN ACE-ETH ESTRAD-FE 1-20 MG-MCG PO TABS: 1.0000 | ORAL_TABLET | Freq: Every day | ORAL | 11 refills | Status: DC

## 2022-03-21 NOTE — Assessment & Plan Note (Signed)
Was briefly on progesterone only contraception with breastfeeding ?More mood issues now  ?No longer breast feeding  ?No h/o blood clots and does not smoke  ? ?Will plan to start junel 1/20 (has taken in past)  ?Did not do well with yaz in the past  ?inst to update if side effects or menstrual problems  ? ?

## 2022-03-21 NOTE — Assessment & Plan Note (Signed)
Some depression and anxiety symptoms/no SI  ?Reviewed stressors/ coping techniques/symptoms/ support sources/ tx options and side effects in detail today ?Plan to try ssri, sertraline 25 mg to titrate up to 50 mg daily if well tolerated and f/u in 1-2 mo  ?Also counseling referral done ?Discussed expectations of SSRI medication including time to effectiveness and mechanism of action, also poss of side effects (early and late)- including mental fuzziness, weight or appetite change, nausea and poss of worse dep or anxiety (even suicidal thoughts)  Pt voiced understanding and will stop med and update if this occurs  ?Discussed imp of self care as well  ?

## 2022-03-21 NOTE — Patient Instructions (Signed)
I will place a counseling referral  ?You will get a call  ? ?Start zoloft 1/2 pill (25 mg) once daily  ?After 1-2 weeks if doing ok , go up to one pill ?Take at night ? ?If any intolerable side effects or if you feel worse-stop it and let us know  ?If you feel suicidal-get to [place] ER   ? ? ? ?

## 2022-03-21 NOTE — Progress Notes (Signed)
? ?Subjective:  ? ? Patient ID: Alexis Bolton, female    DOB: May 22, 1996, 26 y.o.   MRN: [id number] ? ?HPI ?Pt presents for c/o anxiety (post partum) ? ?Wt Readings from Last 3 Encounters:  ?03/21/22 122 lb 4 oz (55.5 kg)  ?09/15/21 124 lb (56.2 kg)  ?08/04/21 144 lb (65.3 kg)  ? ?23.10 kg/m? ? ?Everything that goes on with baby, she blames herself for  ?Baby has had a virus- and that was tough  ?Feels guilty about taking time for herself  ? ?Anxious ?Worried about driving with the baby  ? ?A little bit down  ?Tearful at times  ?Would never hurt herself (due to the baby)  ?Has trouble enjoying things  ?Feels down in general  ? ?This has not happened in the past for more than a day or 2 ?Getting worse the past 2 months ? ?Work is stressful/ a lot going on  ?Would like to eventually change jobs  ?Would like a job where she could be at home more  ? ?Some back pain- chiropractor helps now  ? ?Has done counseling years ago (here)  ? ? ?Is tired  ?Exhausted -sleeps 10-12 hours at times  ?No thyroid issues when she was pregnant  ? ?GAD7 score of 19 ?PHQ score of 20 ? ? ?On micronor for contraception -then her gyn left  ?She is done breast feeding  ?Stopped in early December  ?LMP 4/18  ?Periods are changeable /some heavier than others   3-4 day  ? ?Yaz prior to pregnancy - threw period out of whack and cramps got worse  ?Was on junel also  ? ? ? ?BP Readings from Last 3 Encounters:  ?03/21/22 122/64  ?10/14/21 121/76  ?09/15/21 120/80  ? ? ?Patient Active Problem List  ? Diagnosis Date Noted  ? 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 Contraception management 03/21/2022  ? Sore throat 02/22/2022  ? [redacted] weeks gestation of pregnancy 08/06/2021  ? Postpartum care following vaginal delivery 08/06/2021  ? Encounter for care or examination of lactating mother 08/06/2021  ? Normal labor and delivery 08/04/2021  ? Acute knee pain 04/12/2021  ? Supervision of normal first pregnancy 02/24/2021  ? Seasonal allergic rhinitis 07/31/2016   ? ?Past Medical History:  ?Diagnosis Date  ? Asthma   ? Eczema   ? ?Past Surgical History:  ?Procedure Laterality Date  ? NO PAST SURGERIES    ? ?Social History  ? ?Tobacco Use  ? Smoking status: Never  ? Smokeless tobacco: Never  ?Substance Use Topics  ? Alcohol use: No  ?  Alcohol/week: 0.0 standard drinks  ? Drug use: No  ? ?Family History  ?Family history unknown: Yes  ? ?Allergies  ?Allergen Reactions  ? Other   ?  Nuts, spring mix causes itchy throat  ? Penicillins   ? Pomegranate [[place]]   ?  Itchy throat  ? ?Current Outpatient Medications on File Prior to Visit  ?Medication Sig Dispense Refill  ? acetaminophen (TYLENOL) 325 MG tablet Take 650 mg by mouth every 6 (six) hours as needed.    ? azelastine (ASTELIN) 0.1 % nasal spray     ? EPINEPHrine 0.3 mg/0.3 mL IJ SOAJ injection See admin instructions.    ? fluticasone (FLONASE) 50 MCG/ACT nasal spray 1-2 SPRAY IN EACH NOSTRIL ONCE A DAY NASALLY 90    ? levocetirizine (XYZAL) 5 MG tablet SMARTSIG:1 Tablet(s) By Mouth Every Evening    ? montelukast (SINGULAIR) 10 MG tablet Take 1  tablet by mouth at bedtime.    ? Prenatal Vit-Fe Fumarate-FA (MULTIVITAMIN-PRENATAL) 27-0.8 MG TABS tablet Take 1 tablet by mouth daily at 12 noon.    ? norethindrone (MICRONOR) 0.35 MG tablet Take 1 tablet (0.35 mg total) by mouth daily. (Patient not taking: Reported on 03/21/2022) 28 tablet 11  ? ?No current facility-administered medications on file prior to visit.  ?  ? ?Review of Systems  ?Constitutional:  Negative for activity change, appetite change, fatigue, fever and unexpected weight change.  ?HENT:  Negative for congestion, ear pain, rhinorrhea, sinus pressure and sore throat.   ?Eyes:  Negative for pain, redness and visual disturbance.  ?Respiratory:  Negative for cough, shortness of breath and wheezing.   ?Cardiovascular:  Negative for chest pain and palpitations.  ?Gastrointestinal:  Negative for abdominal pain, blood in stool, constipation and diarrhea.  ?Endocrine:  Negative for polydipsia and polyuria.  ?Genitourinary:  Negative for dysuria, frequency and urgency.  ?Musculoskeletal:  Negative for arthralgias, back pain and myalgias.  ?Skin:  Negative for pallor and rash.  ?Allergic/Immunologic: Negative for environmental allergies.  ?Neurological:  Negative for dizziness, syncope and headaches.  ?Hematological:  Negative for adenopathy. Does not bruise/bleed easily.  ?Psychiatric/Behavioral:  Positive for dysphoric mood. Negative for self-injury, sleep disturbance and suicidal ideas. The patient is nervous/anxious.   ? ?   ?Objective:  ? Physical Exam ?Constitutional:   ?   General: She is not in acute distress. ?   Appearance: Normal appearance. She is well-developed and normal weight. She is not ill-appearing or diaphoretic.  ?HENT:  ?   Head: Normocephalic and atraumatic.  ?   Mouth/Throat:  ?   Mouth: Mucous membranes are moist.  ?Eyes:  ?   General: No scleral icterus. ?   Conjunctiva/sclera: Conjunctivae normal.  ?   Pupils: Pupils are equal, round, and reactive to light.  ?Neck:  ?   Thyroid: No thyromegaly.  ?   Vascular: No carotid bruit or JVD.  ?Cardiovascular:  ?   Rate and Rhythm: Normal rate and regular rhythm.  ?   Heart sounds: Normal heart sounds.  ?  No gallop.  ?Pulmonary:  ?   Effort: Pulmonary effort is normal. No respiratory distress.  ?   Breath sounds: Normal breath sounds. No wheezing or rales.  ?Abdominal:  ?   General: There is no distension or abdominal bruit.  ?   Palpations: Abdomen is soft.  ?Musculoskeletal:  ?   Cervical back: Normal range of motion and neck supple.  ?   Right lower leg: No edema.  ?   Left lower leg: No edema.  ?Lymphadenopathy:  ?   Cervical: No cervical adenopathy.  ?Skin: ?   General: Skin is warm and dry.  ?   Coloration: Skin is not pale.  ?   Findings: No rash.  ?Neurological:  ?   Mental Status: She is alert.  ?   Coordination: Coordination normal.  ?   Deep Tendon Reflexes: Reflexes are normal and symmetric. Reflexes  normal.  ?Psychiatric:     ?   Attention and Perception: Attention normal.     ?   Mood and Affect: Mood is anxious. Affect is blunt.     ?   Speech: Speech normal.     ?   Behavior: Behavior normal.     ?   Thought Content: Thought content normal.     ?   Cognition and Memory: Cognition normal.  ?   Comments: Pleasant  ?Candidly  discusses symptoms and stressors   ? ? ? ? ? ?   ?Assessment & Plan:  ? ?Problem List Items Addressed This Visit   ? ?  ? Other  ? Contraception management  ?  Was briefly on progesterone only contraception with breastfeeding ?More mood issues now  ?No longer breast feeding  ?No h/o blood clots and does not smoke  ? ?Will plan to start junel 1/20 (has taken in past)  ?Did not do well with yaz in the past  ?inst to update if side effects or menstrual problems  ? ? ?  ?  ? Post partum depression - Primary  ?  Some depression and anxiety symptoms/no SI  ?Reviewed stressors/ coping techniques/symptoms/ support sources/ tx options and side effects in detail today ?Plan to try ssri, sertraline 25 mg to titrate up to 50 mg daily if well tolerated and f/u in 1-2 mo  ?Also counseling referral done ?Discussed expectations of SSRI medication including time to effectiveness and mechanism of action, also poss of side effects (early and late)- including mental fuzziness, weight or appetite change, nausea and poss of worse dep or anxiety (even suicidal thoughts)  Pt voiced understanding and will stop med and update if this occurs  ?Discussed imp of self care as well  ? ?  ?  ? Relevant Medications  ? sertraline (ZOLOFT) 50 MG tablet  ? Other Relevant Orders  ? Ambulatory referral to Psychology  ?  ?

## 2022-04-10 ENCOUNTER — Telehealth: Payer: Self-pay | Admitting: Family Medicine

## 2022-04-10 NOTE — Telephone Encounter (Signed)
Leland Her (Father) called and stated "The automatic call system called my daughter Jaelle but stated my name, wants to know the reason why." Wants to speak with the nurse. ? ?Callback Number: [phone] ?

## 2022-04-10 NOTE — Telephone Encounter (Signed)
I can't  discuss anything about this patient with her father , he isn't listed  On HIPPA, also the clinic staff wouldn't handle this . He will need to  speck with his daughter, she could used his number in past. ?

## 2022-04-12 ENCOUNTER — Other Ambulatory Visit: Payer: Self-pay | Admitting: Family Medicine

## 2022-04-12 NOTE — Telephone Encounter (Signed)
Pt is requesting a 90 days , last filled on 03/19/2022. ?

## 2022-05-09 ENCOUNTER — Ambulatory Visit (INDEPENDENT_AMBULATORY_CARE_PROVIDER_SITE_OTHER): Payer: BC Managed Care – PPO | Admitting: Family Medicine

## 2022-05-09 ENCOUNTER — Encounter: Payer: Self-pay | Admitting: Family Medicine

## 2022-05-09 VITALS — BP 102/70 | HR 106 | Temp 98.0°F | Ht 62.0 in | Wt 117.0 lb

## 2022-05-09 DIAGNOSIS — R197 Diarrhea, unspecified: Secondary | ICD-10-CM | POA: Diagnosis not present

## 2022-05-09 DIAGNOSIS — F53 Postpartum depression: Secondary | ICD-10-CM

## 2022-05-09 LAB — CBC WITH DIFFERENTIAL/PLATELET
Basophils Absolute: 0 10*3/uL (ref 0.0–0.1)
Basophils Relative: 0.6 % (ref 0.0–3.0)
Eosinophils Absolute: 0.1 10*3/uL (ref 0.0–0.7)
Eosinophils Relative: 2.1 % (ref 0.0–5.0)
HCT: 39.3 % (ref 36.0–46.0)
Hemoglobin: 13 g/dL (ref 12.0–15.0)
Lymphocytes Relative: 24.5 % (ref 12.0–46.0)
Lymphs Abs: 1.7 10*3/uL (ref 0.7–4.0)
MCHC: 33 g/dL (ref 30.0–36.0)
MCV: 83.3 fl (ref 78.0–100.0)
Monocytes Absolute: 0.6 10*3/uL (ref 0.1–1.0)
Monocytes Relative: 9.3 % (ref 3.0–12.0)
Neutro Abs: 4.3 10*3/uL (ref 1.4–7.7)
Neutrophils Relative %: 63.5 % (ref 43.0–77.0)
Platelets: 344 10*3/uL (ref 150.0–400.0)
RBC: 4.72 Mil/uL (ref 3.87–5.11)
RDW: 18.1 % — ABNORMAL HIGH (ref 11.5–15.5)
WBC: 6.8 10*3/uL (ref 4.0–10.5)

## 2022-05-09 LAB — COMPREHENSIVE METABOLIC PANEL
ALT: 15 U/L (ref 0–35)
AST: 15 U/L (ref 0–37)
Albumin: 4.6 g/dL (ref 3.5–5.2)
Alkaline Phosphatase: 67 U/L (ref 39–117)
BUN: 7 mg/dL (ref 6–23)
CO2: 24 mEq/L (ref 19–32)
Calcium: 9 mg/dL (ref 8.4–10.5)
Chloride: 104 mEq/L (ref 96–112)
Creatinine, Ser: 0.62 mg/dL (ref 0.40–1.20)
GFR: 125.23 mL/min (ref >60.00)
Glucose, Bld: 77 mg/dL (ref 70–99)
Potassium: 3.9 mEq/L (ref 3.5–5.1)
Sodium: 137 mEq/L (ref 135–145)
Total Bilirubin: 0.3 mg/dL (ref 0.2–1.2)
Total Protein: 7.6 g/dL (ref 6.0–8.3)

## 2022-05-09 NOTE — Patient Instructions (Addendum)
Drink fluids   Eat a band diet  Bananas, rice, apple sauce and toast  Small amounts   If diarrhea gets worse -let us know  Or any other symptoms   Lab and stool studies

## 2022-05-09 NOTE — Assessment & Plan Note (Addendum)
Much improved Able to feel joy More motivated  Wants to continue the zoloft 50 mg  Tolerating well   Reviewed stressors/ coping techniques/symptoms/ support sources/ tx options and side effects in detail today  Encouraged self care

## 2022-05-09 NOTE — Assessment & Plan Note (Signed)
3 days  Some mild nausea/no other symptoms  Her baby had virus/respiratiory recently but did vomit May be infectious On zoloft since April- doubt this is cause of the diarrhea Reassuring exam  Disc imp of hydration  Bland diet Handout given ER precautions rev Watch for s/s of dehydration  Lab and stool studies ordered /pend result

## 2022-05-09 NOTE — Progress Notes (Signed)
Subjective:    Patient ID: Alexis Bolton, female    DOB: 01-20-1996, 26 y.o.   MRN: [id number]  HPI Pt presents for f/u of post partum depression  Also diarrhea   Wt Readings from Last 3 Encounters:  05/09/22 117 lb (53.1 kg)  03/21/22 122 lb 4 oz (55.5 kg)  09/15/21 124 lb (56.2 kg)   21.40 kg/m  Seen on 4/18 Started zoloft to titrate to 50 mg daily  Also started OC  (doing well with this)   Getting better  Mood is better  Has helped a lot  Much more motivated   Some diarrhea for 2 days  Not related to her zoloft  No n/v/ or pain or fever   Bm 30 min after she eats  A little nausea / no vomiting   Ate pizza at a party / others ate and did not get sick    Daughter was sick/ ? 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   Contraception management 03/21/2022   Sore throat 02/22/2022   [redacted] weeks gestation of pregnancy 08/06/2021   Postpartum care following vaginal delivery 08/06/2021   Encounter for care or examination of lactating mother 08/06/2021   Normal labor and delivery 08/04/2021   Acute knee pain 04/12/2021   Supervision of normal first pregnancy 02/24/2021   Seasonal allergic rhinitis 07/31/2016   Past Medical History:  Diagnosis Date   Asthma    Eczema    Past Surgical History:  Procedure Laterality Date   NO PAST SURGERIES     Social History   Tobacco Use   Smoking status: Never   Smokeless tobacco: Never  Substance Use Topics   Alcohol use: No    Alcohol/week: 0.0 standard drinks   Drug use: No   Family History  Family history unknown: Yes   Allergies  Allergen Reactions   Other     Nuts, spring mix causes itchy throat   Penicillins    Pomegranate [[place]]     Itchy throat   Current Outpatient Medications on File Prior to Visit  Medication Sig Dispense Refill    acetaminophen (TYLENOL) 325 MG tablet Take 650 mg by mouth every 6 (six) hours as needed.     azelastine (ASTELIN) 0.1 % nasal spray      EPINEPHrine 0.3 mg/0.3 mL IJ SOAJ injection See admin instructions.     fluticasone (FLONASE) 50 MCG/ACT nasal spray 1-2 SPRAY IN EACH NOSTRIL ONCE A DAY NASALLY 90     levocetirizine (XYZAL) 5 MG tablet SMARTSIG:1 Tablet(s) By Mouth Every Evening     montelukast (SINGULAIR) 10 MG tablet Take 1 tablet by mouth at bedtime.     norethindrone (MICRONOR) 0.35 MG tablet Take 1 tablet (0.35 mg total) by mouth daily. 28 tablet 11   norethindrone-ethinyl estradiol-FE (JUNEL FE 1/20) 1-20 MG-MCG tablet Take 1 tablet by mouth daily. 28 tablet 11   Prenatal Vit-Fe Fumarate-FA (MULTIVITAMIN-PRENATAL) 27-0.8 MG TABS tablet Take 1 tablet by mouth daily at 12 noon.     sertraline (ZOLOFT) 50 MG tablet Take 1 tablet (50 mg total) by mouth daily. 90 tablet 3   No current facility-administered medications on file prior to visit.     Review of Systems  Constitutional:  Positive for fatigue. Negative for activity change, appetite change, fever and  unexpected weight change.  HENT:  Negative for congestion, ear pain, rhinorrhea, sinus pressure and sore throat.   Eyes:  Negative for pain, redness and visual disturbance.  Respiratory:  Negative for cough, shortness of breath and wheezing.   Cardiovascular:  Negative for chest pain and palpitations.  Gastrointestinal:  Positive for diarrhea and nausea. Negative for abdominal pain, anal bleeding, blood in stool, constipation, rectal pain and vomiting.       Occ cramping with bms   Endocrine: Negative for polydipsia and polyuria.  Genitourinary:  Negative for dysuria, frequency and urgency.  Musculoskeletal:  Negative for arthralgias, back pain and myalgias.  Skin:  Negative for pallor and rash.  Allergic/Immunologic: Negative for environmental allergies.  Neurological:  Negative for dizziness, syncope and headaches.   Hematological:  Negative for adenopathy. Does not bruise/bleed easily.  Psychiatric/Behavioral:  Negative for decreased concentration, dysphoric mood and sleep disturbance. The patient is not nervous/anxious.       Objective:   Physical Exam Constitutional:      General: She is not in acute distress.    Appearance: Normal appearance. She is well-developed and normal weight. She is not ill-appearing or diaphoretic.  HENT:     Head: Normocephalic and atraumatic.     Mouth/Throat:     Mouth: Mucous membranes are moist.  Eyes:     Conjunctiva/sclera: Conjunctivae normal.     Pupils: Pupils are equal, round, and reactive to light.  Neck:     Thyroid: No thyromegaly.     Vascular: No carotid bruit or JVD.  Cardiovascular:     Rate and Rhythm: Regular rhythm. Tachycardia present.     Heart sounds: Normal heart sounds.    No gallop.  Pulmonary:     Effort: Pulmonary effort is normal. No respiratory distress.     Breath sounds: Normal breath sounds. No wheezing or rales.  Abdominal:     General: Abdomen is flat. Bowel sounds are increased. There is no distension or abdominal bruit.     Palpations: Abdomen is soft. There is no hepatomegaly, splenomegaly, mass or pulsatile mass.     Tenderness: There is generalized abdominal tenderness. There is no right CVA tenderness, left CVA tenderness, guarding or rebound. Negative signs include Murphy's sign and McBurney's sign.     Comments: Very mild abdominal tenderness with deep palpation   Musculoskeletal:     Cervical back: Normal range of motion and neck supple.     Right lower leg: No edema.     Left lower leg: No edema.  Lymphadenopathy:     Cervical: No cervical adenopathy.  Skin:    General: Skin is warm and dry.     Coloration: Skin is not pale.     Findings: No rash.  Neurological:     Mental Status: She is alert.     Coordination: Coordination normal.     Deep Tendon Reflexes: Reflexes are normal and symmetric. Reflexes normal.   Psychiatric:        Attention and Perception: She is inattentive.        Mood and Affect: Mood normal. Mood is not anxious or depressed. Affect is not tearful.        Speech: Speech normal.        Behavior: Behavior normal.        Cognition and Memory: Cognition and memory normal.     Comments: Mood is better   Candidly discusses symptoms and stressors  Assessment & Plan:   Problem List Items Addressed This Visit       Other   Diarrhea - Primary    3 days  Some mild nausea/no other symptoms  Her baby had virus/respiratiory recently but did vomit May be infectious On zoloft since April- doubt this is cause of the diarrhea Reassuring exam  Disc imp of hydration  Bland diet Handout given ER precautions rev Watch for s/s of dehydration  Lab and stool studies ordered /pend result         Relevant Orders   Gastrointestinal Pathogen Pnl RT, PCR   C. difficile GDH and Toxin A/B   CBC with Differential/Platelet   Comprehensive metabolic panel   Post partum depression    Much improved Able to feel joy More motivated  Wants to continue the zoloft 50 mg  Tolerating well   Reviewed stressors/ coping techniques/symptoms/ support sources/ tx options and side effects in detail today  Encouraged self care

## 2022-05-10 LAB — C. DIFFICILE GDH AND TOXIN A/B
GDH ANTIGEN: NOT DETECTED
MICRO NUMBER:: 13489454
SPECIMEN QUALITY:: ADEQUATE
TOXIN A AND B: NOT DETECTED

## 2022-05-11 ENCOUNTER — Encounter: Payer: Self-pay | Admitting: Family Medicine

## 2022-05-11 LAB — GASTROINTESTINAL PATHOGEN PNL
CampyloBacter Group: NOT DETECTED
Norovirus GI/GII: NOT DETECTED
Rotavirus A: NOT DETECTED
Salmonella species: NOT DETECTED
Shiga Toxin 1: NOT DETECTED
Shiga Toxin 2: NOT DETECTED
Shigella Species: NOT DETECTED
Vibrio Group: NOT DETECTED
Yersinia enterocolitica: NOT DETECTED

## 2022-05-12 ENCOUNTER — Telehealth (INDEPENDENT_AMBULATORY_CARE_PROVIDER_SITE_OTHER): Payer: BC Managed Care – PPO | Admitting: Family Medicine

## 2022-05-12 DIAGNOSIS — R197 Diarrhea, unspecified: Secondary | ICD-10-CM

## 2022-05-12 MED ORDER — ONDANSETRON HCL 4 MG PO TABS: 4.0000 mg | ORAL_TABLET | Freq: Three times a day (TID) | ORAL | 0 refills | Status: DC | PRN

## 2022-05-12 NOTE — Telephone Encounter (Signed)
Put in 4 pm if possible

## 2022-05-12 NOTE — Telephone Encounter (Signed)
Schedule pt for 4pm

## 2022-05-12 NOTE — Patient Instructions (Signed)
Continue to drink fluids  Eat a bland diet as tolerated Try zofran for nausea as needed  Try a probiotic like align  If no improvement, try holding your zoloft for 1-2 days Pepto may help diarrhea Watch for symptoms of dehydration  Let us know on Monday if your symptoms are not improving

## 2022-05-12 NOTE — Progress Notes (Unsigned)
Virtual Visit via Video Note  I connected with Alexis Bolton on 05/12/22 at  4:00 PM EDT by a video enabled telemedicine application and verified that I am speaking with the correct person using two identifiers.  Location: Patient: home Provider: office   I discussed the limitations of evaluation and management by telemedicine and the availability of in person appointments. The patient expressed understanding and agreed to proceed.  Parties involved in encounter  Patient: Alexis Bolton  Provider:  Roxy Manns MD   History of Present Illness: Pt presents with diarrhea   Was seen on 6/6  At that time loose stooland nausea but not fomiting  Reassuring exam   Now cramping  Vomited Wednesday in middle of the night  Past few days - just water in stool   Zollie Scale has it now   Labs done Office Visit on 05/09/2022  Component Date Value Ref Range Status   WBC 05/09/2022 6.8  4.0 - 10.5 K/uL Final   RBC 05/09/2022 4.72  3.87 - 5.11 Mil/uL Final   Hemoglobin 05/09/2022 13.0  12.0 - 15.0 g/dL Final   HCT 81/19/1478 39.3  36.0 - 46.0 % Final   MCV 05/09/2022 83.3  78.0 - 100.0 fl Final   MCHC 05/09/2022 33.0  30.0 - 36.0 g/dL Final   RDW 29/56/2130 18.1 (H)  11.5 - 15.5 % Final   Platelets 05/09/2022 344.0  150.0 - 400.0 K/uL Corrected   Result may be falsely decreased due to platelet clumping.   Neutrophils Relative % 05/09/2022 63.5  43.0 - 77.0 % Final   Lymphocytes Relative 05/09/2022 24.5  12.0 - 46.0 % Final   Monocytes Relative 05/09/2022 9.3  3.0 - 12.0 % Final   Eosinophils Relative 05/09/2022 2.1  0.0 - 5.0 % Final   Basophils Relative 05/09/2022 0.6  0.0 - 3.0 % Final   Neutro Abs 05/09/2022 4.3  1.4 - 7.7 K/uL Final   Lymphs Abs 05/09/2022 1.7  0.7 - 4.0 K/uL Final   Monocytes Absolute 05/09/2022 0.6  0.1 - 1.0 K/uL Final   Eosinophils Absolute 05/09/2022 0.1  0.0 - 0.7 K/uL Final   Basophils Absolute 05/09/2022 0.0  0.0 - 0.1 K/uL Final   Sodium 05/09/2022 137  135  - 145 mEq/L Final   Potassium 05/09/2022 3.9  3.5 - 5.1 mEq/L Final   Chloride 05/09/2022 104  96 - 112 mEq/L Final   CO2 05/09/2022 24  19 - 32 mEq/L Final   Glucose, Bld 05/09/2022 77  70 - 99 mg/dL Final   BUN 86/57/8469 7  6 - 23 mg/dL Final   Creatinine, Ser 05/09/2022 0.62  0.40 - 1.20 mg/dL Final   Total Bilirubin 05/09/2022 0.3  0.2 - 1.2 mg/dL Final   Alkaline Phosphatase 05/09/2022 67  39 - 117 U/L Final   AST 05/09/2022 15  0 - 37 U/L Final   ALT 05/09/2022 15  0 - 35 U/L Final   Total Protein 05/09/2022 7.6  6.0 - 8.3 g/dL Final   Albumin 62/95/2841 4.6  3.5 - 5.2 g/dL Final   GFR 32/44/0102 125.23  >60.00 mL/min Final   Calculated using the CKD-EPI Creatinine Equation (2021)   Calcium 05/09/2022 9.0  8.4 - 10.5 mg/dL Final   CampyloBacter Group 05/09/2022 NOT DETECTED  NOT DETECTED Final   Salmonella species 05/09/2022 NOT DETECTED  NOT DETECTED Final   Shigella Species 05/09/2022 NOT DETECTED  NOT DETECTED Final   Vibrio Group 05/09/2022 NOT DETECTED  NOT DETECTED Final  Yersinia enterocolitica 05/09/2022 NOT DETECTED  NOT DETECTED Final   Shiga Toxin 1 05/09/2022 NOT DETECTED  NOT DETECTED Final   Shiga Toxin 2 05/09/2022 NOT DETECTED  NOT DETECTED Final   Norovirus GI/GII 05/09/2022 NOT DETECTED  NOT DETECTED Final   Rotavirus A 05/09/2022 NOT DETECTED  NOT DETECTED Final   Comment: Organisms included in the Campylobacter group include C. coli, C. jejuni, and C. lari. Organisms included in the Shigella species include S. dysenteriae, S. boydii, S. sonnei, and S. flexneri. Organisms included in the Vibrio group include V. cholerae and V. parahaemolyticus. . The analytical performance characteristics of this assay have been determined by [facility]. The modifications have not been cleared or approved by the FDA. This assay has been validated pursuant to the CLIA  regulations and is used for clinical purposes.    MICRO NUMBER: 05/09/2022 [id number]   Final    SPECIMEN QUALITY: 05/09/2022 Adequate   Final   Source 05/09/2022 STOOL   Final   STATUS: 05/09/2022 FINAL   Final   GDH ANTIGEN 05/09/2022 Not Detected   Final   TOXIN A AND B 05/09/2022 Not Detected   Final   COMMENT 05/09/2022 No toxigenic C. difficile detected For additional information, please refer to http://education.[facility].com/faq/FAQ136 (This link is being provided for informational/educational purposes only.)   Final    Felt better wed Went back to work  Then worse that day  Had diarrhea middle of the night  Very cramping   BMs were really frequent Now every 1-2 hours   Zollie ScaleOlivia is having symptoms (sister) also  Nausea and diarrhea-not vomiting and feverish   Cramping is tolerable right now  No blood in stool   A lot of water   She has been able to eat a banana  Lat night and this am  Hot pocket for lunch  Pasta last night   No rapid HR or dry mouth   Zoloft since April     Patient Active Problem List   Diagnosis Date Noted   Diarrhea 05/09/2022   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   Contraception management 03/21/2022   Sore throat 02/22/2022   [redacted] weeks gestation of pregnancy 08/06/2021   Postpartum care following vaginal delivery 08/06/2021   Encounter for care or examination of lactating mother 08/06/2021   Normal labor and delivery 08/04/2021   Acute knee pain 04/12/2021   Supervision of normal first pregnancy 02/24/2021   Seasonal allergic rhinitis 07/31/2016   Past Medical History:  Diagnosis Date   Asthma    Eczema    Past Surgical History:  Procedure Laterality Date   NO PAST SURGERIES     Social History   Tobacco Use   Smoking status: Never   Smokeless tobacco: Never  Substance Use Topics   Alcohol use: No    Alcohol/week: 0.0 standard drinks of alcohol   Drug use: No   Family History  Family history unknown: Yes   Allergies  Allergen Reactions   Other     Nuts, spring mix causes itchy throat   Penicillins     Pomegranate [Punica]     Itchy throat   Current Outpatient Medications on File Prior to Visit  Medication Sig Dispense Refill   acetaminophen (TYLENOL) 325 MG tablet Take 650 mg by mouth every 6 (six) hours as needed.     azelastine (ASTELIN) 0.1 % nasal spray      EPINEPHrine 0.3 mg/0.3 mL IJ SOAJ injection See admin instructions.     fluticasone (  FLONASE) 50 MCG/ACT nasal spray 1-2 SPRAY IN EACH NOSTRIL ONCE A DAY NASALLY 90     levocetirizine (XYZAL) 5 MG tablet SMARTSIG:1 Tablet(s) By Mouth Every Evening     montelukast (SINGULAIR) 10 MG tablet Take 1 tablet by mouth at bedtime.     norethindrone (MICRONOR) 0.35 MG tablet Take 1 tablet (0.35 mg total) by mouth daily. 28 tablet 11   norethindrone-ethinyl estradiol-FE (JUNEL FE 1/20) 1-20 MG-MCG tablet Take 1 tablet by mouth daily. 28 tablet 11   Prenatal Vit-Fe Fumarate-FA (MULTIVITAMIN-PRENATAL) 27-0.8 MG TABS tablet Take 1 tablet by mouth daily at 12 noon.     sertraline (ZOLOFT) 50 MG tablet Take 1 tablet (50 mg total) by mouth daily. 90 tablet 3   No current facility-administered medications on file prior to visit.    Review of Systems  Constitutional:  Positive for malaise/fatigue. Negative for chills and fever.  HENT:  Negative for congestion, ear pain, sinus pain and sore throat.   Eyes:  Negative for blurred vision, discharge and redness.  Respiratory:  Negative for cough, shortness of breath and stridor.   Cardiovascular:  Negative for chest pain, palpitations and leg swelling.  Gastrointestinal:  Positive for diarrhea, nausea and vomiting. Negative for abdominal pain.  Musculoskeletal:  Negative for myalgias.  Skin:  Negative for rash.  Neurological:  Negative for dizziness and headaches.    Observations/Objective: Patient appears well, in no distress Weight is baseline  No facial swelling or asymmetry Normal voice-not hoarse and no slurred speech No obvious tremor or mobility impairment Moving neck and UEs  normally Able to hear the call well  No cough or shortness of breath during interview  Talkative and mentally sharp with no cognitive changes No skin changes on face or neck , no rash or pallor Affect is normal    Assessment and Plan: Problem List Items Addressed This Visit       Other   Diarrhea - Primary    This improved and then worsened again with nausea and one episode of vomiting (now fam member is sick) Neg stool path panel   Adv to start a probotic Hydrate/ER precautions  Bland diet as tolerated zofran sent 4 mg for prn use  Consider holding sertraline 1-2 d in case this is a side eff (doubt) If not improved Monday will repeat stool panel, inst to update [place]        Follow Up Instructions: Continue to drink fluids  Eat a bland diet as tolerated Try zofran for nausea as needed  Try a probiotic like align  If no improvement, try holding your zoloft for 1-2 days Pepto may help diarrhea Watch for symptoms of dehydration  Let us know on Monday if your symptoms are not improving    I discussed the assessment and treatment plan with the patient. The patient was provided an opportunity to ask questions and all were answered. The patient agreed with the plan and demonstrated an understanding of the instructions.   The patient was advised to call back or seek an in-person evaluation if the symptoms worsen or if the condition fails to improve as anticipated.     Roxy Manns, MD

## 2022-05-14 ENCOUNTER — Encounter: Payer: Self-pay | Admitting: Family Medicine

## 2022-05-14 NOTE — Assessment & Plan Note (Signed)
This improved and then worsened again with nausea and one episode of vomiting (now fam member is sick) Neg stool path panel   Adv to start a probotic Hydrate/ER precautions  Bland diet as tolerated zofran sent 4 mg for prn use  Consider holding sertraline 1-2 d in case this is a side eff (doubt) If not improved Monday will repeat stool panel, inst to update [place]

## 2022-06-13 ENCOUNTER — Other Ambulatory Visit (HOSPITAL_COMMUNITY)
Admission: RE | Admit: 2022-06-13 | Discharge: 2022-06-13 | Disposition: A | Discharge status: Home / Self Care | Payer: BC Managed Care – PPO | Source: Ambulatory Visit | Attending: Family | Admitting: Family

## 2022-06-13 ENCOUNTER — Ambulatory Visit (INDEPENDENT_AMBULATORY_CARE_PROVIDER_SITE_OTHER): Payer: BC Managed Care – PPO | Admitting: Family

## 2022-06-13 ENCOUNTER — Encounter: Payer: Self-pay | Admitting: Family

## 2022-06-13 ENCOUNTER — Other Ambulatory Visit: Payer: Self-pay | Admitting: Family

## 2022-06-13 VITALS — BP 110/72 | HR 94 | Temp 98.7°F | Ht 62.0 in | Wt 115.0 lb

## 2022-06-13 DIAGNOSIS — B3731 Acute candidiasis of vulva and vagina: Secondary | ICD-10-CM | POA: Insufficient documentation

## 2022-06-13 DIAGNOSIS — Z113 Encounter for screening for infections with a predominantly sexual mode of transmission: Secondary | ICD-10-CM | POA: Insufficient documentation

## 2022-06-13 DIAGNOSIS — N760 Acute vaginitis: Secondary | ICD-10-CM

## 2022-06-13 NOTE — Progress Notes (Signed)
Patient ID: Alexis Bolton, female    DOB: 1996-02-16, 26 y.o.   MRN: [id number]  Chief Complaint  Patient presents with   Vaginitis    Pt c/o vaginal irritation due to latex. Itchy, pain during urination and redness in vaginal area since today. Has not tried anything for symptoms.     HPI: Vaginitis: Patient complains of abnormal vaginal irritation. Vaginal symptoms include itching, pain, no odor STI Risk/HX: low, no hx, checking today Discharge described as: None Other associated symptoms: No pelvic pain Menstrual pattern: regular Contraception: OCP    Assessment & Plan:  1. Acute vaginitis mild erythema, no discharge noted, sending swab for STD screen, BV, yeast. Advised ok to use vaseline on labia to help with irritation. Avoid condom use until healed.  - Cervicovaginal ancillary only    Subjective:    Outpatient Medications Prior to Visit  Medication Sig Dispense Refill   acetaminophen (TYLENOL) 325 MG tablet Take 650 mg by mouth every 6 (six) hours as needed.     azelastine (ASTELIN) 0.1 % nasal spray as needed.     EPINEPHrine 0.3 mg/0.3 mL IJ SOAJ injection See admin instructions.     fluticasone (FLONASE) 50 MCG/ACT nasal spray as needed.     levocetirizine (XYZAL) 5 MG tablet SMARTSIG:1 Tablet(s) By Mouth Every Evening     montelukast (SINGULAIR) 10 MG tablet Take 1 tablet by mouth at bedtime.     norethindrone-ethinyl estradiol-FE (JUNEL FE 1/20) 1-20 MG-MCG tablet Take 1 tablet by mouth daily. 28 tablet 11   ondansetron (ZOFRAN) 4 MG tablet Take 1 tablet (4 mg total) by mouth every 8 (eight) hours as needed for nausea or vomiting. Caution of sedatio n 20 tablet 0   Prenatal Vit-Fe Fumarate-FA (MULTIVITAMIN-PRENATAL) 27-0.8 MG TABS tablet Take 1 tablet by mouth daily at 12 noon.     sertraline (ZOLOFT) 50 MG tablet Take 1 tablet (50 mg total) by mouth daily. 90 tablet 3   norethindrone (MICRONOR) 0.35 MG tablet Take 1 tablet (0.35 mg total) by mouth daily. 28  tablet 11   No facility-administered medications prior to visit.   Past Medical History:  Diagnosis Date   Asthma    Eczema    Past Surgical History:  Procedure Laterality Date   NO PAST SURGERIES     Allergies  Allergen Reactions   Other     Nuts, spring mix causes itchy throat   Penicillins    Pomegranate [Punica]     Itchy throat      Objective:    Physical Exam Vitals and nursing note reviewed.  Constitutional:      Appearance: Normal appearance.  Cardiovascular:     Rate and Rhythm: Normal rate and regular rhythm.  Pulmonary:     Effort: Pulmonary effort is normal.     Breath sounds: Normal breath sounds.  Genitourinary:    Exam position: Lithotomy position.     Pubic Area: No rash or pubic lice.      Labia:        Right: No rash.        Left: No rash.      Vagina: Erythema (very mild, introitus) present. No vaginal discharge, tenderness or bleeding.  Musculoskeletal:        General: Normal range of motion.  Skin:    General: Skin is warm and dry.  Neurological:     Mental Status: She is alert.  Psychiatric:        Mood and Affect:  Mood normal.        Behavior: Behavior normal.    BP 110/72 (BP Location: Left Arm, Patient Position: Sitting, Cuff Size: Large)   Pulse 94   Temp 98.7 F (37.1 C) (Temporal)   Ht 5\' 2"  (1.575 m)   Wt 115 lb (52.2 kg)   LMP 05/22/2022 (Exact Date)   SpO2 98%   Breastfeeding No   BMI 21.03 kg/m  Wt Readings from Last 3 Encounters:  06/13/22 115 lb (52.2 kg)  05/09/22 117 lb (53.1 kg)  03/21/22 122 lb 4 oz (55.5 kg)       03/23/22, NP

## 2022-06-16 LAB — MOLECULAR ANCILLARY ONLY
Bacterial Vaginitis (gardnerella): NEGATIVE
Candida Glabrata: NEGATIVE
Candida Vaginitis: POSITIVE — AB
Chlamydia: NEGATIVE
Comment: NEGATIVE
Comment: NEGATIVE
Comment: NEGATIVE
Comment: NEGATIVE
Comment: NEGATIVE
Comment: NORMAL
Neisseria Gonorrhea: NEGATIVE
Trichomonas: NEGATIVE

## 2022-06-18 ENCOUNTER — Other Ambulatory Visit: Payer: Self-pay | Admitting: Family

## 2022-06-18 DIAGNOSIS — N760 Acute vaginitis: Secondary | ICD-10-CM

## 2022-06-18 MED ORDER — FLUCONAZOLE 150 MG PO TABS: ORAL_TABLET | ORAL | 0 refills | Status: DC

## 2022-06-18 NOTE — Progress Notes (Signed)
Hi Alexis Bolton,  I sent over Diflucan to take for your yeast infection.   Take care.

## 2022-06-28 ENCOUNTER — Ambulatory Visit
Admission: EM | Admit: 2022-06-28 | Discharge: 2022-06-28 | Disposition: A | Discharge status: Home / Self Care | Payer: BC Managed Care – PPO | Attending: Family Medicine | Admitting: Family Medicine

## 2022-06-28 DIAGNOSIS — J039 Acute tonsillitis, unspecified: Secondary | ICD-10-CM | POA: Diagnosis not present

## 2022-06-28 DIAGNOSIS — L040 Acute lymphadenitis of face, head and neck: Secondary | ICD-10-CM | POA: Diagnosis not present

## 2022-06-28 LAB — POCT RAPID STREP A (OFFICE): Rapid Strep A Screen: NEGATIVE

## 2022-06-28 MED ORDER — AZITHROMYCIN 500 MG PO TABS: 500.0000 mg | ORAL_TABLET | Freq: Every day | ORAL | 0 refills | Status: AC

## 2022-06-28 NOTE — ED Triage Notes (Signed)
Pt presents with complaints of sore throat x 3 days. Denies fever.

## 2022-06-28 NOTE — Discharge Instructions (Signed)
Prescribed Azithromycin instead of Amoxicillin as this medication class is listed as a allergy, Complete one tablet of Azithromycin daily for 5 days.  Apply warm compresses to tender lymph node to help with pain. He also may take Tylenol or ibuprofen as needed to alleviate tonsillar pain along with lymph node tenderness.

## 2022-06-28 NOTE — ED Provider Notes (Signed)
Alexis Bolton    CSN: [id number] Arrival date & time: 06/28/22  1021      History   Chief Complaint Chief Complaint  Patient presents with   Sore Throat    HPI Alexis Bolton is a 26 y.o. female.   HPI Patient with a history of asthma and eczema presents today with sore throat.  Reports the pain is localized to the right side only. She endorses today symptoms are worse and she is having pain with swallowing and talking due to the severity of throat pain. 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   Contraception management 03/21/2022   Sore throat 02/22/2022   [redacted] weeks gestation of pregnancy 08/06/2021   Postpartum care following vaginal delivery 08/06/2021   Encounter for care or examination of lactating mother 08/06/2021   Normal labor and delivery 08/04/2021   Acute knee pain 04/12/2021   Supervision of normal first pregnancy 02/24/2021   Seasonal allergic rhinitis 07/31/2016    Past Surgical History:  Procedure Laterality Date   NO PAST SURGERIES      OB History     Gravida  1   Para  1   Term  1   Preterm      AB      Living  1      SAB      IAB      Ectopic      Multiple  0   Live Births  1            Home Medications    Prior to Admission medications   Medication Sig Start Date End Date Taking? Authorizing Provider  azithromycin (ZITHROMAX) 500 MG tablet Take 1 tablet (500 mg total) by mouth daily for 5 days. 06/28/22 07/03/22 Yes Bing Neighbors, FNP  acetaminophen (TYLENOL) 325 MG tablet Take 650 mg by mouth every 6 (six) hours as needed.    [provider]  azelastine (ASTELIN) 0.1 % nasal spray as needed. 04/01/21   [provider]  EPINEPHrine 0.3 mg/0.3 mL IJ SOAJ injection See admin instructions.    [provider]  fluconazole (DIFLUCAN) 150 MG tablet Take 1 pill today and the 2nd pill in 3 days. 06/18/22   Dulce Sellar, NP  fluticasone (FLONASE) 50 MCG/ACT nasal spray as needed.    [provider]  levocetirizine (XYZAL) 5 MG tablet SMARTSIG:1 Tablet(s) By Mouth Every Evening 04/01/21   [provider]  montelukast (SINGULAIR) 10 MG tablet Take 1 tablet by mouth at bedtime. 04/01/21   [provider]  norethindrone-ethinyl estradiol-FE (JUNEL FE 1/20) 1-20 MG-MCG tablet Take 1 tablet by mouth daily. 03/21/22   Tower, Audrie Gallus, MD  ondansetron (ZOFRAN) 4 MG tablet Take 1 tablet (4 mg total) by mouth every 8 (eight) hours as needed for nausea or vomiting. Caution of sedatio n 05/12/22   Tower, Audrie Gallus, MD  Prenatal Vit-Fe Fumarate-FA (MULTIVITAMIN-PRENATAL) 27-0.8 MG TABS tablet Take 1 tablet by mouth daily at 12 noon.    [provider]  sertraline (ZOLOFT) 50 MG tablet Take 1 tablet (50 mg total) by mouth daily. 04/12/22   Tower, Audrie Gallus, MD    Family History Family History  Family history unknown: Yes    Social History  Social History   Tobacco Use   Smoking status: Never   Smokeless tobacco: Never  Substance Use Topics   Alcohol use: No    Alcohol/week: 0.0 standard drinks of alcohol   Drug use: No     Allergies   Other, Penicillins, and Pomegranate [punica]   Review of Systems Review of Systems   Physical Exam Triage Vital Signs ED Triage Vitals [06/28/22 1042]  Enc Vitals Group     BP 104/67     Pulse Rate 96     Resp 16     Temp 98.8 F (37.1 C)     Temp Source Oral     SpO2 97 %     Weight      Height      Head Circumference      Peak Flow      Pain Score      Pain Loc      Pain Edu?      Excl. in GC?    No data found.  Updated Vital Signs BP 104/67 (BP Location: Right Arm)   Pulse 96   Temp 98.8 F (37.1 C) (Oral)   Resp 16   LMP 06/25/2022 (Exact Date)   SpO2 97%   Visual Acuity Right Eye Distance:    Left Eye Distance:   Bilateral Distance:    Right Eye Near:   Left Eye Near:    Bilateral Near:     Physical Exam Vitals reviewed.  Constitutional:      Appearance: She is well-developed.  HENT:     Head: Normocephalic and atraumatic.     Mouth/Throat:     Tonsils: 4+ on the right. 2+ on the left.     Comments: Right tonsillar swelling with erythema no exudate  Left side unaffected. Eyes:     Conjunctiva/sclera: Conjunctivae normal.     Pupils: Pupils are equal, round, and reactive to light.  Neck:     Comments: Right cervical adenopathy with localized tenderness  Cardiovascular:     Rate and Rhythm: Normal rate and regular rhythm.  Pulmonary:     Effort: Pulmonary effort is normal.     Breath sounds: Normal breath sounds.  Skin:    General: Skin is warm and dry.     Capillary Refill: Capillary refill takes less than 2 seconds.  Neurological:     General: No focal deficit present.     Mental Status: She is alert and oriented to person, place, and time.  Psychiatric:        Mood and Affect: Mood normal.      UC Treatments / Results  Labs (all labs ordered are listed, but only abnormal results are displayed) Labs Reviewed  POCT RAPID STREP A (OFFICE)    EKG   Radiology No results found.  Procedures Procedures (including critical care time)  Medications Ordered in UC Medications - No data to display  Initial Impression / Assessment and Plan / UC Course  I have reviewed the triage vital signs and the nursing notes.  Pertinent labs & imaging results that were available during my care of the patient were reviewed by me and considered in my medical decision making (see chart for details).    Rapid strep negative. No culture ordered Treating for tonsillitis and cervical adenitis Complete entire course of medication. Apply warm compressed  to enlarged and tender lymph node to help reduce pain. Return if symptoms worsen or do not improve.  Final Clinical  Impressions(s) /  UC Diagnoses   Final diagnoses:  Acute tonsillitis, unspecified etiology, right   Acute cervical adenitis, right      Discharge Instructions      Prescribed Azithromycin instead of Amoxicillin as this medication class is listed as a allergy, Complete one tablet of Azithromycin daily for 5 days.  Apply warm compresses to tender lymph node to help with pain. He also may take Tylenol or ibuprofen as needed to alleviate tonsillar pain along with lymph node tenderness.     ED Prescriptions     Medication Sig Dispense Auth. Provider   azithromycin (ZITHROMAX) 500 MG tablet Take 1 tablet (500 mg total) by mouth daily for 5 days. 5 tablet Bing Neighbors, FNP      PDMP not reviewed this encounter.   Bing Neighbors, FNP 06/28/22 1134

## 2022-10-04 ENCOUNTER — Encounter: Payer: Self-pay | Admitting: Obstetrics and Gynecology

## 2022-10-04 ENCOUNTER — Ambulatory Visit (INDEPENDENT_AMBULATORY_CARE_PROVIDER_SITE_OTHER): Payer: BC Managed Care – PPO | Admitting: Obstetrics and Gynecology

## 2022-10-04 VITALS — BP 104/71 | HR 77 | Ht 61.0 in | Wt 110.8 lb

## 2022-10-04 DIAGNOSIS — N926 Irregular menstruation, unspecified: Secondary | ICD-10-CM

## 2022-10-04 DIAGNOSIS — Z3009 Encounter for other general counseling and advice on contraception: Secondary | ICD-10-CM

## 2022-10-04 NOTE — Progress Notes (Signed)
Patient presents today to discuss an extended menstrual cycle. Patient states typically having a 3-4 day cycle on daily OCP's and her last cycle was 10 days. She reports it was heavier than usual with ongoing cramping. No additional concerns at this time.

## 2022-10-04 NOTE — Progress Notes (Signed)
HPI:      [name] is a 26 y.o. G1P1001 who LMP was Patient's last menstrual period was 09/20/2022.  Subjective:   She presents today to discuss birth control methods.  She has been taking OCPs and her last menses lasted 10 days with significant cramping.  She says that she just lay on the couch for the first 4 days.  She is interested in a better cycle control method and the method that she does not have to think about every day.    Hx: The following portions of the patient's history were reviewed and updated as appropriate:             She  has a past medical history of Asthma and Eczema. She does not have any pertinent problems on file. She  has a past surgical history that includes No past surgeries. Her Family history is unknown by patient. She  reports that she has never smoked. She has never used smokeless tobacco. She reports that she does not drink alcohol and does not use drugs. She has a current medication list which includes the following prescription(s): acetaminophen, azelastine, epinephrine, fluticasone, levocetirizine, montelukast, norethindrone-ethinyl estradiol-fe, and sertraline. She is allergic to other, penicillins, and pomegranate [punica].       Review of Systems:  Review of Systems  Constitutional: Denied constitutional symptoms, night sweats, recent illness, fatigue, fever, insomnia and weight loss.  Eyes: Denied eye symptoms, eye pain, photophobia, vision change and visual disturbance.  Ears/Nose/Throat/Neck: Denied ear, nose, throat or neck symptoms, hearing loss, nasal discharge, sinus congestion and sore throat.  Cardiovascular: Denied cardiovascular symptoms, arrhythmia, chest pain/pressure, edema, exercise intolerance, orthopnea and palpitations.  Respiratory: Denied pulmonary symptoms, asthma, pleuritic pain, productive sputum, cough, dyspnea and wheezing.  Gastrointestinal: Denied, gastro-esophageal reflux, melena, nausea and vomiting.   Genitourinary: See HPI for additional information.  Musculoskeletal: Denied musculoskeletal symptoms, stiffness, swelling, muscle weakness and myalgia.  Dermatologic: Denied dermatology symptoms, rash and scar.  Neurologic: Denied neurology symptoms, dizziness, headache, neck pain and syncope.  Psychiatric: Denied psychiatric symptoms, anxiety and depression.  Endocrine: Denied endocrine symptoms including hot flashes and night sweats.   Meds:   Current Outpatient Medications on File Prior to Visit  Medication Sig Dispense Refill   acetaminophen (TYLENOL) 325 MG tablet Take 650 mg by mouth every 6 (six) hours as needed.     azelastine (ASTELIN) 0.1 % nasal spray as needed.     EPINEPHrine 0.3 mg/0.3 mL IJ SOAJ injection See admin instructions.     fluticasone (FLONASE) 50 MCG/ACT nasal spray as needed.     levocetirizine (XYZAL) 5 MG tablet SMARTSIG:1 Tablet(s) By Mouth Every Evening     montelukast (SINGULAIR) 10 MG tablet Take 1 tablet by mouth at bedtime.     norethindrone-ethinyl estradiol-FE (JUNEL FE 1/20) 1-20 MG-MCG tablet Take 1 tablet by mouth daily. 28 tablet 11   sertraline (ZOLOFT) 50 MG tablet Take 1 tablet (50 mg total) by mouth daily. 90 tablet 3   No current facility-administered medications on file prior to visit.      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 Birth control counseling   2. Irregular menstrual cycle        Plan:            1.  Birth Control I discussed multiple birth control options and methods with the patient.  The risks and benefits of each were reviewed. IUD Literature on IUD made available.  Risks and benefits discussed.  She is considering IUD as an option for birth/cycle control.  Orders No orders of the defined types were placed in this encounter.   No orders of the defined types were placed in this encounter.     F/U  Return in about 2 weeks (around 10/18/2022). I spent 18 minutes involved in the care of this patient preparing to see the patient by obtaining and reviewing her medical history (including labs, imaging tests and prior procedures), documenting clinical information in the electronic health record (EHR), counseling and coordinating care plans, writing and sending prescriptions, ordering tests or procedures and in direct communicating with the patient and medical staff discussing pertinent items from her history and physical exam.  Finis Bud, M.D. 10/04/2022 9:33 AM

## 2022-10-05 ENCOUNTER — Encounter: Payer: Self-pay | Admitting: Internal Medicine

## 2022-10-05 ENCOUNTER — Ambulatory Visit (INDEPENDENT_AMBULATORY_CARE_PROVIDER_SITE_OTHER): Payer: BC Managed Care – PPO | Admitting: Internal Medicine

## 2022-10-05 VITALS — BP 102/78 | HR 90 | Temp 98.1°F | Ht 61.0 in | Wt 109.0 lb

## 2022-10-05 DIAGNOSIS — J039 Acute tonsillitis, unspecified: Secondary | ICD-10-CM

## 2022-10-05 DIAGNOSIS — J029 Acute pharyngitis, unspecified: Secondary | ICD-10-CM | POA: Diagnosis not present

## 2022-10-05 DIAGNOSIS — J0391 Acute recurrent tonsillitis, unspecified: Secondary | ICD-10-CM | POA: Insufficient documentation

## 2022-10-05 LAB — POCT RAPID STREP A (OFFICE): Rapid Strep A Screen: NEGATIVE

## 2022-10-05 NOTE — Progress Notes (Signed)
Subjective:    Patient ID: Alexis Bolton, female    DOB: March 23, 1996, 26 y.o.   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     azelastine (ASTELIN) 0.1 % nasal spray as needed.     EPINEPHrine 0.3 mg/0.3 mL IJ SOAJ injection See admin instructions.     fluticasone (FLONASE) 50 MCG/ACT nasal spray as needed.     levocetirizine (XYZAL) 5 MG tablet SMARTSIG:1 Tablet(s) By Mouth Every Evening     montelukast (SINGULAIR) 10 MG tablet Take 1 tablet by mouth at bedtime.     norethindrone-ethinyl estradiol-FE (JUNEL FE 1/20) 1-20 MG-MCG tablet Take 1 tablet by mouth daily. 28 tablet 11   sertraline (ZOLOFT) 50 MG tablet Take 1 tablet (50 mg total) by mouth daily. 90 tablet 3   No current facility-administered medications on file prior to visit.    Allergies  Allergen Reactions   Other     Nuts, spring mix causes itchy throat   Penicillins    Pomegranate [Punica]     Itchy throat    Past Medical History:  Diagnosis Date   Asthma    Eczema     Past Surgical History:  Procedure Laterality Date   NO PAST SURGERIES      Family History  Family history unknown: Yes    Social History   Socioeconomic History   Marital status: Single    Spouse name: Not on file   Number of children: Not on file   Years of education: Not on file   Highest education level: Not on file  Occupational History   Not on file  Tobacco Use   Smoking status: Never    Passive exposure: Never   Smokeless tobacco: Never  Vaping Use   Vaping Use: Not on file  Substance and Sexual Activity   Alcohol  use: No    Alcohol/week: 0.0 standard drinks of alcohol   Drug use: No   Sexual activity: Yes    Birth control/protection: Pill  Other Topics Concern   Not on file  Social History Narrative   Lives with mom, dad and sister   Social Determinants of Health   Financial Resource Strain: Not on file  Food Insecurity: Not on file  Transportation Needs: Not on file  Physical Activity: Not on file  Stress: Not on file  Social Connections: Not on file  Intimate Partner Violence: Not on file   Review of Systems No rash No N/V No pain or trouble swallowing Eating okay No regular heartburn     Objective:   Physical Exam Constitutional:      Appearance: Normal appearance.     Comments: hoarse  HENT:     Right Ear: Tympanic membrane and ear canal normal.     Left Ear: Tympanic membrane and ear canal normal.     Nose: No congestion.     Mouth/Throat:     Comments: Tonsils 3+ bilaterally No focal swelling or inflammation Pulmonary:     Effort: Pulmonary effort is normal.     Breath sounds: Normal breath sounds. No  wheezing or rales.  Musculoskeletal:     Cervical back: Neck supple.  Lymphadenopathy:     Cervical: No cervical adenopathy.  Neurological:     Mental Status: She is alert.            Assessment & Plan:

## 2022-10-05 NOTE — Assessment & Plan Note (Signed)
Recurrent Strep is negative---discussed viral etiology Can use analgesics prn, gargles, cough drops if help Voice rest will help---OOW till 11/6 No indication for ENT eval at this point

## 2022-10-16 ENCOUNTER — Ambulatory Visit (INDEPENDENT_AMBULATORY_CARE_PROVIDER_SITE_OTHER): Payer: BC Managed Care – PPO | Admitting: Family Medicine

## 2022-10-16 ENCOUNTER — Encounter: Payer: Self-pay | Admitting: Family Medicine

## 2022-10-16 VITALS — BP 118/60 | HR 98 | Temp 98.3°F | Ht 61.0 in | Wt 112.2 lb

## 2022-10-16 DIAGNOSIS — J0391 Acute recurrent tonsillitis, unspecified: Secondary | ICD-10-CM | POA: Diagnosis not present

## 2022-10-16 NOTE — Progress Notes (Signed)
Subjective:    Patient ID: Alexis Bolton, female    DOB: 19-Aug-1996, 26 y.o.   MRN: [id number]  HPI Pt presents to discuss tonsillitis   Wt Readings from Last 3 Encounters:  10/05/22 109 lb (49.4 kg)  10/04/22 110 lb 12.8 oz (50.3 kg)  06/13/22 115 lb (52.2 kg)   Was seen by Dr Alphonsus Sias 11/2 Rapid strep negative  Disc likely viral etiology   Has trouble with her R throat/tonsil area  Ever since she had her baby that side gives her trouble  Can feel swelling on the outside of the neck   Sore throat took a week but it is better now  No fever  Ended up with cold and cough / bronchitis   It still feels swollen No pain /no problems swallowing  No sinus or ear pain   Patient Active Problem List   Diagnosis Date Noted   Recurrent tonsillitis 10/05/2022   Diarrhea 05/09/2022   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   Contraception management 03/21/2022   Sore throat 02/22/2022   [redacted] weeks gestation of pregnancy 08/06/2021   Postpartum care following vaginal delivery 08/06/2021   Encounter for care or examination of lactating mother 08/06/2021   Normal labor and delivery 08/04/2021   Acute knee pain 04/12/2021   Supervision of normal first pregnancy 02/24/2021   Seasonal allergic rhinitis 07/31/2016   Past Medical History:  Diagnosis Date   Asthma    Eczema    Past Surgical History:  Procedure Laterality Date   NO PAST SURGERIES     Social History   Tobacco Use   Smoking status: Never    Passive exposure: Never   Smokeless tobacco: Never  Substance Use Topics   Alcohol use: No    Alcohol/week: 0.0 standard drinks of alcohol   Drug use: No   Family History  Family history unknown: Yes   Allergies  Allergen Reactions   Other     Nuts, spring mix causes itchy throat   Penicillins    Pomegranate [Punica]     Itchy throat   Current Outpatient Medications on File Prior to Visit  Medication Sig Dispense Refill   acetaminophen (TYLENOL) 325 MG tablet Take 650 mg  by mouth every 6 (six) hours as needed.     azelastine (ASTELIN) 0.1 % nasal spray as needed.     EPINEPHrine 0.3 mg/0.3 mL IJ SOAJ injection See admin instructions.     fluticasone (FLONASE) 50 MCG/ACT nasal spray as needed.     levocetirizine (XYZAL) 5 MG tablet SMARTSIG:1 Tablet(s) By Mouth Every Evening     montelukast (SINGULAIR) 10 MG tablet Take 1 tablet by mouth at bedtime.     norethindrone-ethinyl estradiol-FE (JUNEL FE 1/20) 1-20 MG-MCG tablet Take 1 tablet by mouth daily. 28 tablet 11   sertraline (ZOLOFT) 50 MG tablet Take 1 tablet (50 mg total) by mouth daily. 90 tablet 3   No current facility-administered medications on file prior to visit.    Review of Systems  Constitutional:  Positive for fatigue. Negative for activity change, appetite change, fever and unexpected weight change.  HENT:  Negative for congestion, ear pain, rhinorrhea, sinus pressure, sore throat, trouble swallowing and voice change.        Sore throat is better today   Eyes:  Negative for pain, redness and visual disturbance.  Respiratory:  Negative for cough, shortness of breath and wheezing.   Cardiovascular:  Negative for chest pain and palpitations.  Gastrointestinal:  Negative for  abdominal pain, blood in stool, constipation and diarrhea.  Endocrine: Negative for polydipsia and polyuria.  Genitourinary:  Negative for dysuria, frequency and urgency.  Musculoskeletal:  Negative for arthralgias, back pain and myalgias.  Skin:  Negative for pallor and rash.  Allergic/Immunologic: Negative for environmental allergies.       Pt feels like glands in R neck are frequently swollen  Neurological:  Negative for dizziness, syncope and headaches.  Hematological:  Negative for adenopathy. Does not bruise/bleed easily.  Psychiatric/Behavioral:  Negative for decreased concentration and dysphoric mood. The patient is not nervous/anxious.        Objective:   Physical Exam Constitutional:      General: She is not  in acute distress.    Appearance: She is well-developed and normal weight. She is not ill-appearing or diaphoretic.  HENT:     Head: Normocephalic and atraumatic.     Right Ear: Tympanic membrane and ear canal normal.     Left Ear: Tympanic membrane and ear canal normal.     Nose: Nose normal. No congestion or rhinorrhea.     Mouth/Throat:     Mouth: Mucous membranes are moist.     Pharynx: Oropharynx is clear. No oropharyngeal exudate or posterior oropharyngeal erythema.     Comments: Baseline large tonsils They are symmetric No erythema No tonsil debris/stones  Eyes:     General: No scleral icterus.       Right eye: No discharge.        Left eye: No discharge.     Conjunctiva/sclera: Conjunctivae normal.     Pupils: Pupils are equal, round, and reactive to light.  Neck:     Thyroid: No thyromegaly.     Vascular: No carotid bruit or JVD.     Comments: No adenopathy noted today  Cardiovascular:     Rate and Rhythm: Regular rhythm. Tachycardia present.     Heart sounds: Normal heart sounds.     No gallop.  Pulmonary:     Effort: Pulmonary effort is normal. No respiratory distress.     Breath sounds: Normal breath sounds. No stridor. No wheezing, rhonchi or rales.  Abdominal:     General: There is no distension or abdominal bruit.     Palpations: Abdomen is soft.  Musculoskeletal:     Cervical back: Normal range of motion and neck supple. No tenderness.     Right lower leg: No edema.     Left lower leg: No edema.  Lymphadenopathy:     Cervical: No cervical adenopathy.  Skin:    General: Skin is warm and dry.     Coloration: Skin is not pale.     Findings: No rash.  Neurological:     Mental Status: She is alert.     Coordination: Coordination normal.     Deep Tendon Reflexes: Reflexes are normal and symmetric. Reflexes normal.  Psychiatric:        Mood and Affect: Mood normal.           Assessment & Plan:   Problem List Items Addressed This Visit        Respiratory   Recurrent tonsillitis - Primary    Pt notes recurrent R throat pain with tonsil swelling every time she catches a bug/uri  This with some tenderness under R jaw Today is resolved/just got over viral uri No strep  No h/o mono  No tonsil stones  She desires ENT visit to address further and see if there is anything to  prevent it or any further eval needing done  Inst to call if symptoms suddenly worsen      Relevant Orders   Ambulatory referral to ENT

## 2022-10-16 NOTE — Assessment & Plan Note (Signed)
Pt notes recurrent R throat pain with tonsil swelling every time she catches a bug/uri  This with some tenderness under R jaw Today is resolved/just got over viral uri No strep  No h/o mono  No tonsil stones  She desires ENT visit to address further and see if there is anything to prevent it or any further eval needing done  Inst to call if symptoms suddenly worsen

## 2022-10-16 NOTE — Patient Instructions (Signed)
Keep drinking fluids  Salt water gargle is great for sore throat and keep tonsils clear   If symptoms re occur let us know   I placed a referral to ENT  If you don't get a cal in 1-2 weeks please let us know

## 2022-10-24 ENCOUNTER — Ambulatory Visit (INDEPENDENT_AMBULATORY_CARE_PROVIDER_SITE_OTHER): Payer: BC Managed Care – PPO | Admitting: Obstetrics and Gynecology

## 2022-10-24 ENCOUNTER — Encounter: Payer: Self-pay | Admitting: Obstetrics and Gynecology

## 2022-10-24 VITALS — BP 122/89 | HR 80 | Ht 61.0 in | Wt 110.8 lb

## 2022-10-24 DIAGNOSIS — Z30430 Encounter for insertion of intrauterine contraceptive device: Secondary | ICD-10-CM

## 2022-10-24 MED ORDER — LEVONORGESTREL 20 MCG/DAY IU IUD
1.0000 | INTRAUTERINE_SYSTEM | Freq: Once | INTRAUTERINE | Status: AC
  Administered 2022-10-24: 1 via INTRAUTERINE

## 2022-10-24 NOTE — Progress Notes (Signed)
Patient presents today for IUD insertion. Consent signed. Patient states no other questions or concerns.

## 2022-10-24 NOTE — Progress Notes (Signed)
HPI:      [name] is a 26 y.o. G1P1001 who LMP was Patient's last menstrual period was 10/24/2022.  Subjective:   She presents today for IUD insertion.  She would like something for cycle control and birth control.  She has elected to have an IUD placed.  She is currently on her menses.    Hx: The following portions of the patient's history were reviewed and updated as appropriate:             She  has a past medical history of Asthma and Eczema. She does not have any pertinent problems on file. She  has a past surgical history that includes No past surgeries. Her Family history is unknown by patient. She  reports that she has never smoked. She has never been exposed to tobacco smoke. She has never used smokeless tobacco. She reports that she does not drink alcohol and does not use drugs. She has a current medication list which includes the following prescription(s): acetaminophen, azelastine, epinephrine, fluticasone, levocetirizine, montelukast, norethindrone-ethinyl estradiol-fe, and sertraline. She is allergic to other, penicillins, and pomegranate [punica].       Review of Systems:  Review of Systems  Constitutional: Denied constitutional symptoms, night sweats, recent illness, fatigue, fever, insomnia and weight loss.  Eyes: Denied eye symptoms, eye pain, photophobia, vision change and visual disturbance.  Ears/Nose/Throat/Neck: Denied ear, nose, throat or neck symptoms, hearing loss, nasal discharge, sinus congestion and sore throat.  Cardiovascular: Denied cardiovascular symptoms, arrhythmia, chest pain/pressure, edema, exercise intolerance, orthopnea and palpitations.  Respiratory: Denied pulmonary symptoms, asthma, pleuritic pain, productive sputum, cough, dyspnea and wheezing.  Gastrointestinal: Denied, gastro-esophageal reflux, melena, nausea and vomiting.  Genitourinary: Denied genitourinary symptoms including symptomatic vaginal discharge, pelvic relaxation issues,  and urinary complaints.  Musculoskeletal: Denied musculoskeletal symptoms, stiffness, swelling, muscle weakness and myalgia.  Dermatologic: Denied dermatology symptoms, rash and scar.  Neurologic: Denied neurology symptoms, dizziness, headache, neck pain and syncope.  Psychiatric: Denied psychiatric symptoms, anxiety and depression.  Endocrine: Denied endocrine symptoms including hot flashes and night sweats.   Meds:   Current Outpatient Medications on File Prior to Visit  Medication Sig Dispense Refill   acetaminophen (TYLENOL) 325 MG tablet Take 650 mg by mouth every 6 (six) hours as needed.     azelastine (ASTELIN) 0.1 % nasal spray as needed.     EPINEPHrine 0.3 mg/0.3 mL IJ SOAJ injection See admin instructions.     fluticasone (FLONASE) 50 MCG/ACT nasal spray as needed.     levocetirizine (XYZAL) 5 MG tablet SMARTSIG:1 Tablet(s) By Mouth Every Evening     montelukast (SINGULAIR) 10 MG tablet Take 1 tablet by mouth at bedtime.     norethindrone-ethinyl estradiol-FE (JUNEL FE 1/20) 1-20 MG-MCG tablet Take 1 tablet by mouth daily. 28 tablet 11   sertraline (ZOLOFT) 50 MG tablet Take 1 tablet (50 mg total) by mouth daily. 90 tablet 3   No current facility-administered medications on file prior to visit.    Objective:     Vitals:   10/24/22 1056  BP: 122/89  Pulse: 80    Physical examination   Pelvic:   Vulva: Normal appearance.  No lesions.  Vagina: No lesions or abnormalities noted.  Support: Normal pelvic support.  Urethra No masses tenderness or scarring.  Meatus Normal size without lesions or prolapse.  Cervix: Normal appearance.  No lesions.  Anus: Normal exam.  No lesions.  Perineum: Normal exam.  No lesions.  Bimanual   Uterus: Normal size.  Non-tender.  Mobile.  AV.  Adnexae: No masses.  Non-tender to palpation.  Cul-de-sac: Negative for abnormality.   IUD Procedure Pt has read the booklet and signed the appropriate forms regarding the Mirena IUD.  All  of her questions have been answered.   The cervix was cleansed with betadine solution.  After sounding the uterus and noting the position, the IUD was placed in the usual manner without problem.  The string was cut to the appropriate length.  The patient tolerated the procedure well.            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 Encounter for IUD insertion       Plan:             F/U  Return in about 4 weeks (around 11/21/2022) for For IUD f/u.  Finis Bud, M.D. 10/24/2022 11:57 AM

## 2022-10-30 ENCOUNTER — Encounter: Payer: Self-pay | Admitting: Obstetrics and Gynecology

## 2022-11-22 ENCOUNTER — Encounter: Payer: Self-pay | Admitting: Obstetrics and Gynecology

## 2022-11-22 ENCOUNTER — Ambulatory Visit (INDEPENDENT_AMBULATORY_CARE_PROVIDER_SITE_OTHER): Payer: BC Managed Care – PPO | Admitting: Obstetrics and Gynecology

## 2022-11-22 VITALS — BP 127/87 | HR 80 | Ht 61.0 in | Wt 112.4 lb

## 2022-11-22 DIAGNOSIS — Z30431 Encounter for routine checking of intrauterine contraceptive device: Secondary | ICD-10-CM | POA: Diagnosis not present

## 2022-11-22 NOTE — Progress Notes (Signed)
Patient presents today for IUD string check. She states lightly bleeding. Patient reports no pain, discomfort or trouble with intercourse. No other questions or concerns.

## 2022-11-22 NOTE — Progress Notes (Signed)
HPI:      [name] is a 26 y.o. G1P1001 who LMP was Patient's last menstrual period was 10/24/2022.  Subjective:   She presents today for IUD follow-up.  She received an IUD for birth cycle control and birth control.  She reports that she is still having some bleeding.  She is not having any pain.  She has had intercourse without issue.    Hx: The following portions of the patient's history were reviewed and updated as appropriate:             She  has a past medical history of Asthma and Eczema. She does not have any pertinent problems on file. She  has a past surgical history that includes No past surgeries. Her Family history is unknown by patient. She  reports that she has never smoked. She has never been exposed to tobacco smoke. She has never used smokeless tobacco. She reports that she does not drink alcohol and does not use drugs. She has a current medication list which includes the following prescription(s): acetaminophen, azelastine, epinephrine, fluticasone, levocetirizine, montelukast, norethindrone-ethinyl estradiol-fe, and sertraline. She is allergic to other, penicillins, and pomegranate [punica].       Review of Systems:  Review of Systems  Constitutional: Denied constitutional symptoms, night sweats, recent illness, fatigue, fever, insomnia and weight loss.  Eyes: Denied eye symptoms, eye pain, photophobia, vision change and visual disturbance.  Ears/Nose/Throat/Neck: Denied ear, nose, throat or neck symptoms, hearing loss, nasal discharge, sinus congestion and sore throat.  Cardiovascular: Denied cardiovascular symptoms, arrhythmia, chest pain/pressure, edema, exercise intolerance, orthopnea and palpitations.  Respiratory: Denied pulmonary symptoms, asthma, pleuritic pain, productive sputum, cough, dyspnea and wheezing.  Gastrointestinal: Denied, gastro-esophageal reflux, melena, nausea and vomiting.  Genitourinary: Denied genitourinary symptoms including  symptomatic vaginal discharge, pelvic relaxation issues, and urinary complaints.  Musculoskeletal: Denied musculoskeletal symptoms, stiffness, swelling, muscle weakness and myalgia.  Dermatologic: Denied dermatology symptoms, rash and scar.  Neurologic: Denied neurology symptoms, dizziness, headache, neck pain and syncope.  Psychiatric: Denied psychiatric symptoms, anxiety and depression.  Endocrine: Denied endocrine symptoms including hot flashes and night sweats.   Meds:   Current Outpatient Medications on File Prior to Visit  Medication Sig Dispense Refill   acetaminophen (TYLENOL) 325 MG tablet Take 650 mg by mouth every 6 (six) hours as needed.     azelastine (ASTELIN) 0.1 % nasal spray as needed.     EPINEPHrine 0.3 mg/0.3 mL IJ SOAJ injection See admin instructions.     fluticasone (FLONASE) 50 MCG/ACT nasal spray as needed.     levocetirizine (XYZAL) 5 MG tablet SMARTSIG:1 Tablet(s) By Mouth Every Evening     montelukast (SINGULAIR) 10 MG tablet Take 1 tablet by mouth at bedtime.     norethindrone-ethinyl estradiol-FE (JUNEL FE 1/20) 1-20 MG-MCG tablet Take 1 tablet by mouth daily. 28 tablet 11   sertraline (ZOLOFT) 50 MG tablet Take 1 tablet (50 mg total) by mouth daily. 90 tablet 3   No current facility-administered medications on file prior to visit.      Objective:     Vitals:   11/22/22 1047  BP: 127/87  Pulse: 80   Filed Weights   11/22/22 1047  Weight: 112 lb 6.4 oz (51 kg)              Physical examination   Pelvic:   Vulva: Normal appearance.  No lesions.  Vagina: No lesions or abnormalities noted.  Support: Normal pelvic support.  Urethra No masses tenderness  or scarring.  Meatus Normal size without lesions or prolapse.  Cervix: Normal appearance.  No lesions. IUD strings noted at cervical os.  Anus: Normal exam.  No lesions.  Perineum: Normal exam.  No lesions.        Bimanual   Uterus: Normal size.  Non-tender.  Mobile.  AV.  Adnexae: No masses.   Non-tender to palpation.  Cul-de-sac: Negative for abnormality.             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 Encounter for routine checking of intrauterine contraceptive device (IUD)        Plan:            1.  Expect bleeding to resolve in the next 2 weeks.  Consider OCPs for 1 month if this does not happen and if the patient has concerns. Orders No orders of the defined types were placed in this encounter.   No orders of the defined types were placed in this encounter.     F/U  Return for Annual Physical. I spent 16 minutes involved in the care of this patient preparing to see the patient by obtaining and reviewing her medical history (including labs, imaging tests and prior procedures), documenting clinical information in the electronic health record (EHR), counseling and coordinating care plans, writing and sending prescriptions, ordering tests or procedures and in direct communicating with the patient and medical staff discussing pertinent items from her history and physical exam.  Elonda Husky, M.D. 11/22/2022 11:16 AM

## 2022-11-29 ENCOUNTER — Ambulatory Visit
Admission: EM | Admit: 2022-11-29 | Discharge: 2022-11-29 | Disposition: A | Discharge status: Home / Self Care | Payer: BC Managed Care – PPO

## 2022-11-29 DIAGNOSIS — R6889 Other general symptoms and signs: Secondary | ICD-10-CM

## 2022-11-29 NOTE — ED Provider Notes (Signed)
[facility]    CSN: [id number] Arrival date & time: 11/29/22  1020      History   Chief Complaint Chief Complaint  Patient presents with   Generalized Body Aches   Chills   Sore Throat    HPI Alexis Bolton is a 26 y.o. female.    Sore Throat    Presents to urgent care with complaint of body aches, chills, hot flashes, sore throat starting last night.  She denies documented fever.  Presents today with normal body temp but elevated heart rate of 102 bpm.  She endorses positive contact with someone with influenza.   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   Contraception management 03/21/2022   Sore throat 02/22/2022   [redacted] weeks gestation of pregnancy 08/06/2021   Postpartum care following vaginal delivery 08/06/2021   Encounter for care or examination of lactating mother 08/06/2021   Normal labor and delivery 08/04/2021   Acute knee pain 04/12/2021   Supervision of normal first pregnancy 02/24/2021   Seasonal allergic rhinitis 07/31/2016    Past Surgical History:  Procedure Laterality Date   NO PAST SURGERIES      OB History     Gravida  1   Para  1   Term  1   Preterm      AB      Living  1      SAB      IAB      Ectopic      Multiple  0   Live Births  1            Home Medications    Prior to Admission medications   Medication Sig Start Date End Date Taking? Authorizing Provider  acetaminophen (TYLENOL) 325 MG tablet Take 650 mg by mouth every 6 (six) hours as needed.    [provider]  azelastine (ASTELIN) 0.1 % nasal spray as needed. 04/01/21   [provider]  EPINEPHrine 0.3 mg/0.3 mL IJ SOAJ injection See admin instructions.    [provider]  fluticasone (FLONASE) 50 MCG/ACT nasal spray as needed.    [provider]  levocetirizine (XYZAL) 5 MG tablet SMARTSIG:1 Tablet(s) By Mouth Every Evening 04/01/21   [provider]  montelukast (SINGULAIR) 10 MG tablet Take 1 tablet by mouth at bedtime. 04/01/21   [provider]  norethindrone-ethinyl estradiol-FE (JUNEL FE 1/20) 1-20 MG-MCG tablet Take 1 tablet by mouth daily. 03/21/22   Tower, Audrie Gallus, MD  sertraline (ZOLOFT) 50 MG tablet Take 1 tablet (50 mg total) by mouth daily. 04/12/22   Tower, Audrie Gallus, MD    Family History Family History  Family history unknown: Yes    Social History Social History   Tobacco Use   Smoking status: Never    Passive exposure: Never   Smokeless tobacco: Never  Substance Use Topics   Alcohol use: No    Alcohol/week: 0.0 standard drinks of alcohol   Drug use: No     Allergies   Other, Penicillins, and Pomegranate [punica]   Review of Systems Review of Systems   Physical Exam Triage Vital Signs ED Triage Vitals  Enc Vitals Group     BP 11/29/22 1211 118/78     Pulse Rate 11/29/22 1211 (!) 102  Resp 11/29/22 1211 18     Temp 11/29/22 1211 97.9 F (36.6 C)     Temp src --      SpO2 11/29/22 1211 95 %     Weight --      Height --      Head Circumference --      Peak Flow --      Pain Score 11/29/22 1212 5     Pain Loc --      Pain Edu? --      Excl. in GC? --    No data found.  Updated Vital Signs BP 118/78   Pulse (!) 102   Temp 97.9 F (36.6 C)   Resp 18   SpO2 95%   Visual Acuity Right Eye Distance:   Left Eye Distance:   Bilateral Distance:    Right Eye Near:   Left Eye Near:    Bilateral Near:     Physical Exam Vitals reviewed.  Constitutional:      Appearance: She is well-developed. She is not ill-appearing.  HENT:     Mouth/Throat:     Tonsils: 3+ on the right. 3+ on the left.  Cardiovascular:     Rate and Rhythm: Normal rate and regular rhythm.     Heart sounds: Normal heart sounds.  Pulmonary:     Effort: Pulmonary effort is normal.      Breath sounds: Normal breath sounds.  Skin:    General: Skin is warm and dry.  Neurological:     General: No focal deficit present.     Mental Status: She is alert and oriented to person, place, and time.  Psychiatric:        Mood and Affect: Mood normal.        Behavior: Behavior normal.      UC Treatments / Results  Labs (all labs ordered are listed, but only abnormal results are displayed) Labs Reviewed - No data to display  EKG   Radiology No results found.  Procedures Procedures (including critical care time)  Medications Ordered in UC Medications - No data to display  Initial Impression / Assessment and Plan / UC Course  I have reviewed the triage vital signs and the nursing notes.  Pertinent labs & imaging results that were available during my care of the patient were reviewed by me and considered in my medical decision making (see chart for details).   Patient is afebrile here without recent antipyretics. Satting well on room air. Overall is well appearing, well hydrated, without respiratory distress. Pulmonary exam is unremarkable.  Lungs CTAB without wheezing, rhonchi, rales.  No pharyngeal erythema or peritonsillar exudates.  Tonsils are 3+ bilaterally which she states is chronic.  Suspect viral process including influenza.  Discussed possible treatment for influenza a presumptively with Tamiflu and patient declined stating she will "push through" the symptoms.  She requests work note.  Recommending continued use of OTC medication for symptom control.    Final Clinical Impressions(s) / UC Diagnoses   Final diagnoses:  None   Discharge Instructions   None    ED Prescriptions   None    PDMP not reviewed this encounter.   Charma Igo, [place] 11/29/22 1225

## 2022-11-29 NOTE — ED Triage Notes (Signed)
Pt. Presents to UC w/ c/o body aches, chills, hot flashes and a sore throat that started last night.

## 2022-11-29 NOTE — Discharge Instructions (Signed)
You have been diagnosed with a viral upper respiratory infection based on your symptoms and exam. Viral illnesses cannot be treated with antibiotics - they are self limiting - and you should find your symptoms resolving within a few days. Get plenty of rest and non-caffeinated fluids. Watch for signs of dehydration including reduced urine output and dark colored urine.  You declined antiviral therapy with Tamiflu for presumptive influenza diagnosis.  We recommend you use over-the-counter medications for symptom control including acetaminophen (Tylenol), ibuprofen (Advil/Motrin) or naproxen (Aleve) for fever, chills or body aches. You may combine use of acetaminophen and ibuprofen/naproxen if needed. Also recommend cold/cough medication.  Saline mist spray is helpful for removing excess mucus from your nose.  Room humidifiers are helpful to ease breathing at night. I recommend guaifenesin (Mucinex) to help thin and loosen mucus secretions in your respiratory passages.   If appropriate based upon your other medical problems, you might also find relief of nasal/sinus congestion symptoms by using a nasal decongestant such as Flonase (fluticasone) or Sudafed sinus (pseudoephedrine).  You will need to obtain Sudafed from behind the pharmacist counter.  Speak to the pharmacist to verify that you are not duplicating medications with other over-the-counter formulations that you may be using.   Follow up here or with your primary care provider if your symptoms are worsening or not improving.

## 2022-12-18 IMAGING — US US OB FOLLOW-UP
1 series · 13 of 28 positions shown · non-contrast
Comparison: none

CLINICAL DATA: Scan for growth and AFI. 24-year-old gravida 1 para
0 at 29 weeks 5 days by assigned EDC of 08/09/2021.

EXAM:
OBSTETRICAL ULTRASOUND >14 WKS

[Series 1: us ob follow up · 13 of 53 slices shown]
[im 2/53]
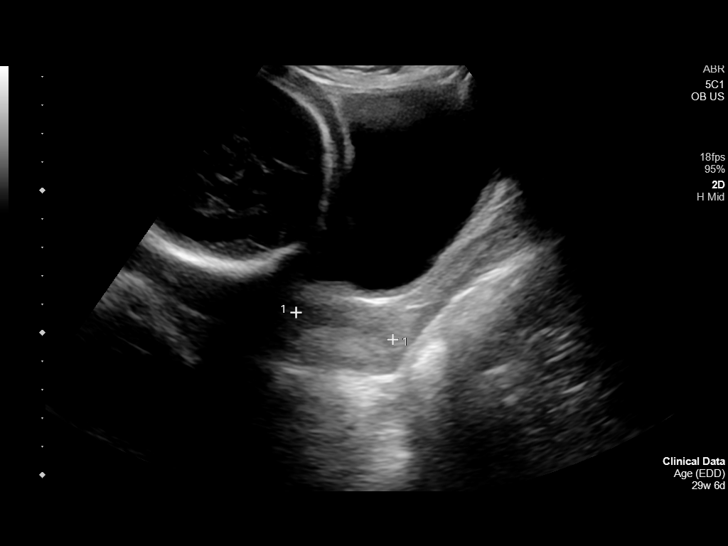
[im 6/53]
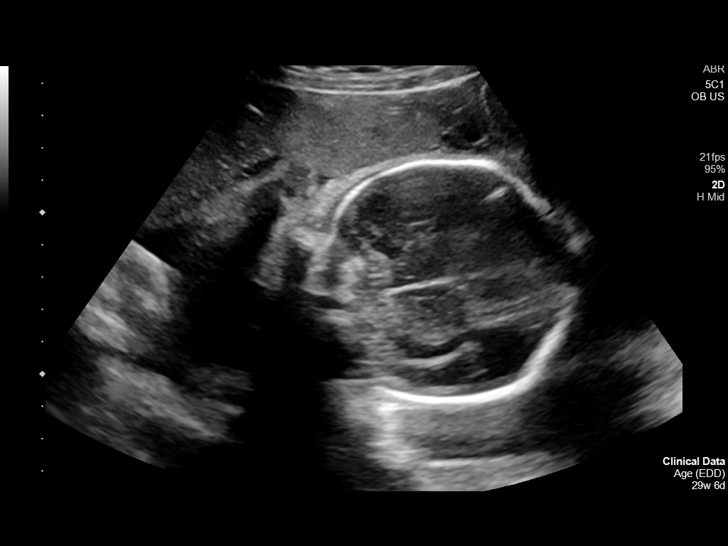
[im 10/53]
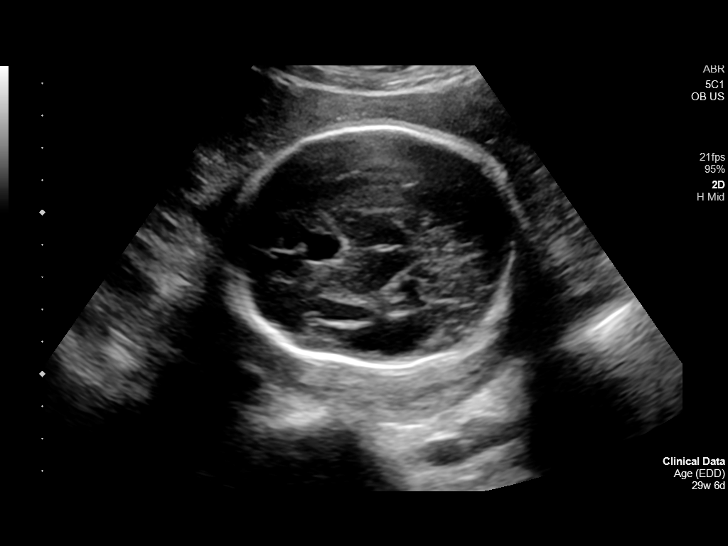
[im 14/53]
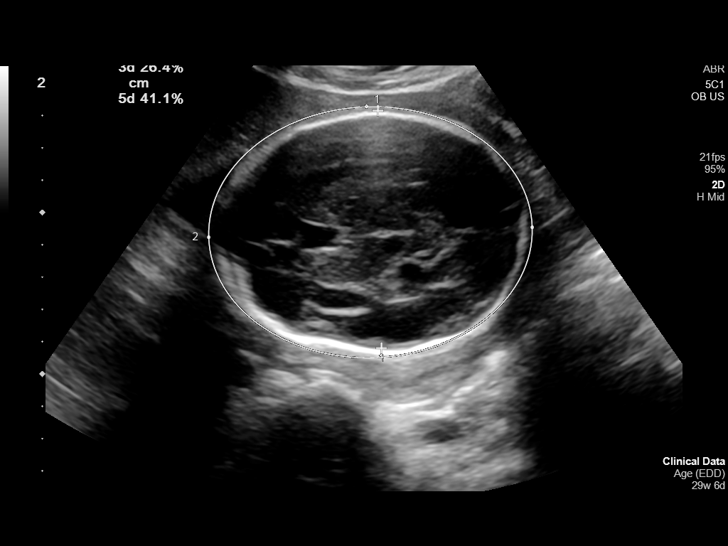
[im 18/53]
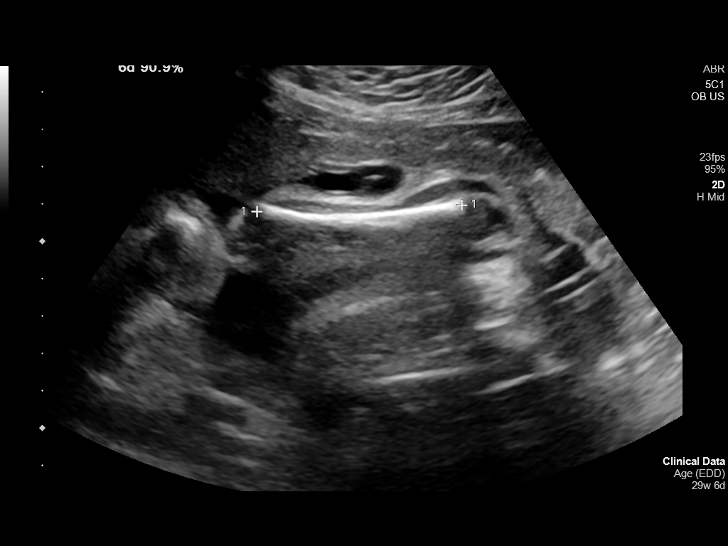
[im 22/53]
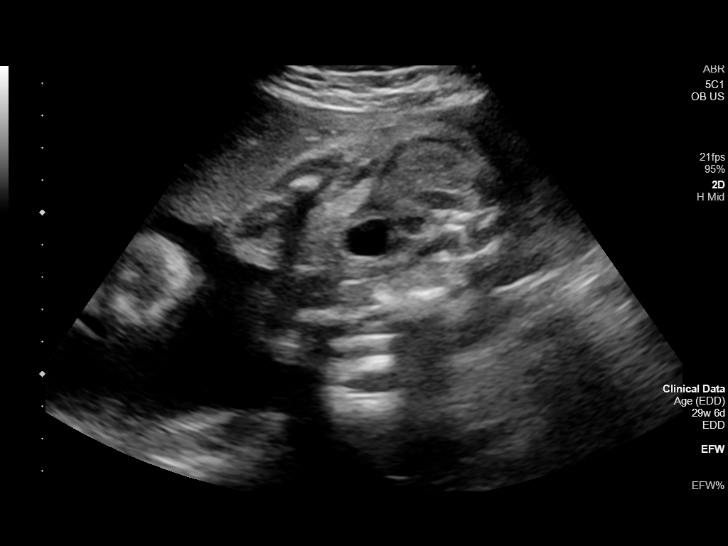
[im 27/53]
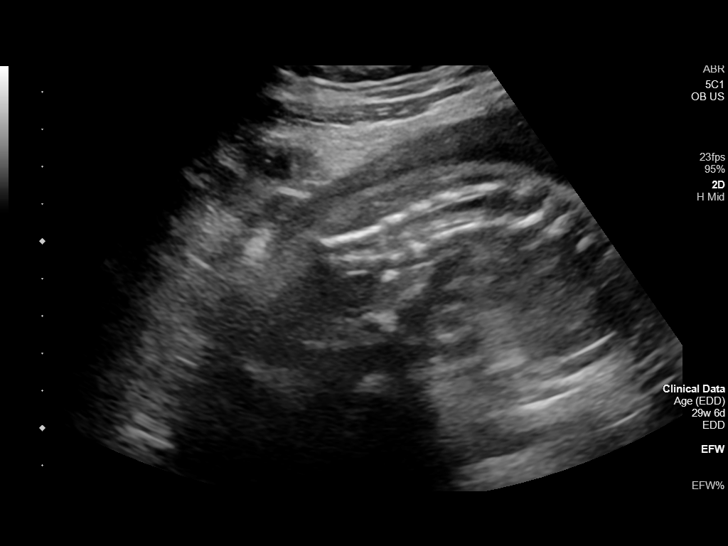
[im 31/53]
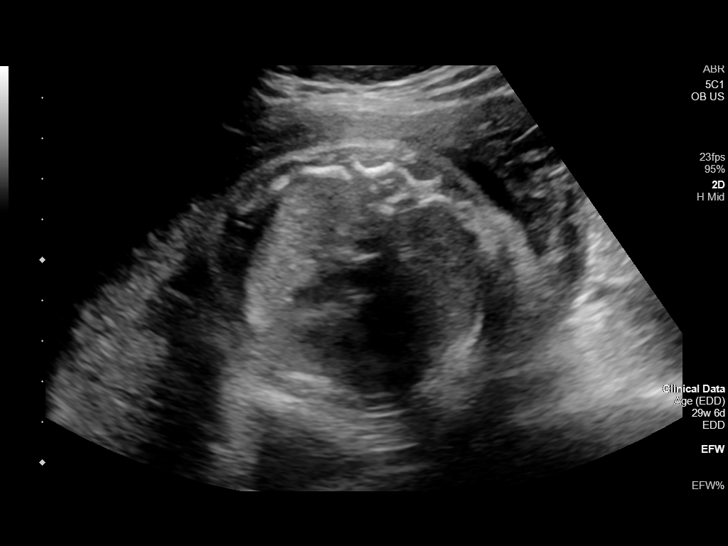
[im 35/53]
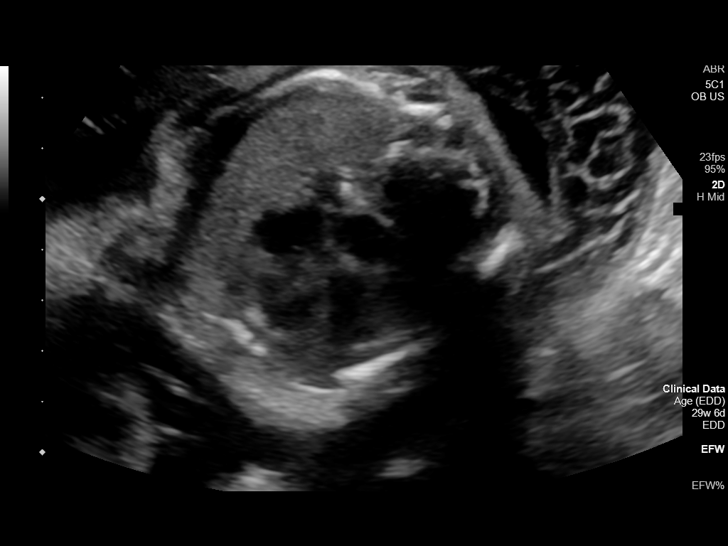
[im 39/53]
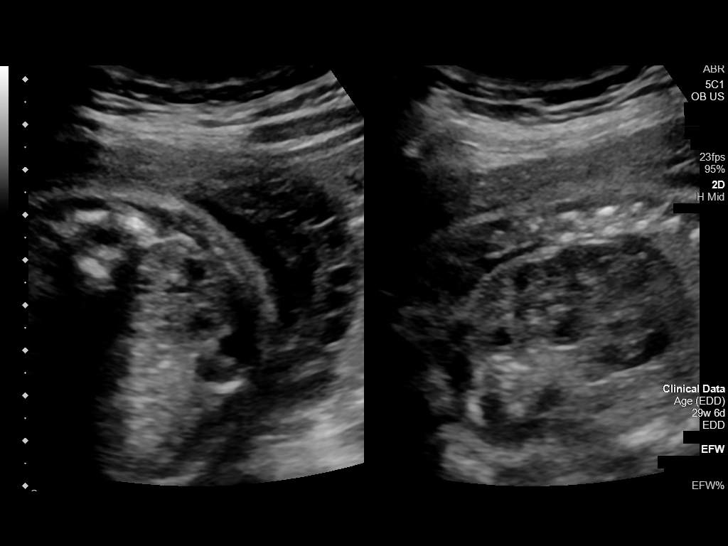
[im 43/53]
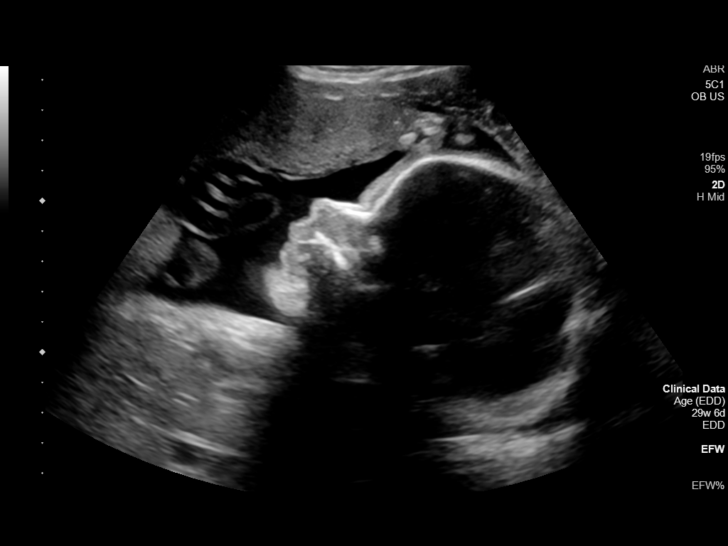
[im 47/53]
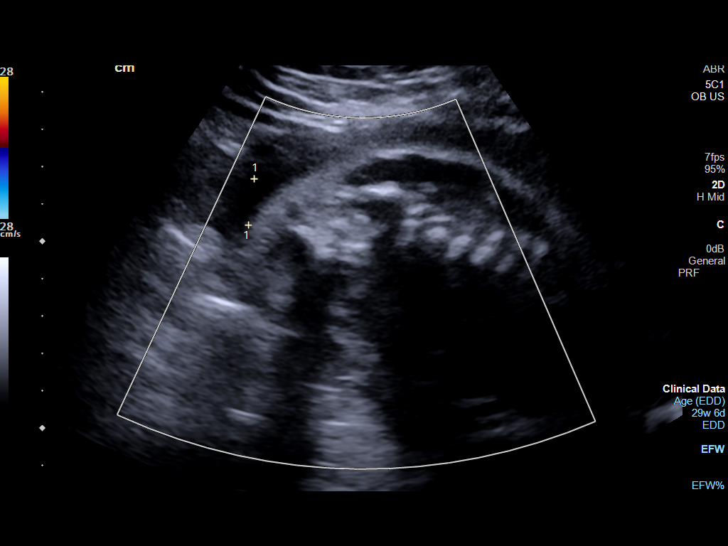
[im 51/53]
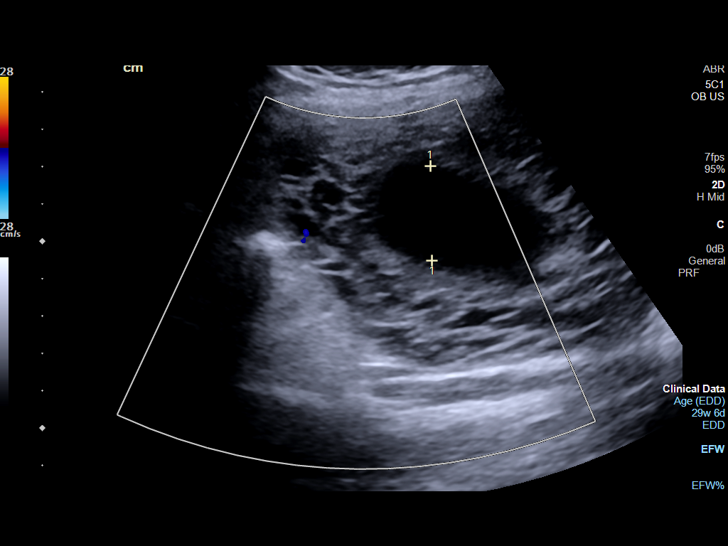

[13 of 28 positions shown; findings below may reference images not displayed]

FINDINGS: Number of Fetuses: 1

Heart Rate:  141 bpm

Movement: Present

Presentation: Cephalic

Previa: None

Placental Location: Anterior

Amniotic Fluid (Subjective): Normal

Amniotic Fluid (Objective):

AFI = 12.9 cm (5%ile= 9.2 cm, 95%= 23.1 cm for 29 wks)

FETAL BIOMETRY

BPD: 7.36cm 29w 4d

HC:   27.82cm 30w 3d

AC:   25.09cm 29w 2d

FL:   5.83cm 30w 3d

Current Mean GA: 30w 2d US EDC: 08/06/2021

Assigned GA:  29w 5d Assigned EDC: 08/09/2021

Estimated Fetal Weight:  1458.5g 35.2 percentile

By previous ultrasound, estimated fetal weight was 431.4 grams,
percentile.

FETAL ANATOMY

Lateral Ventricles: Appears normal

Thalami/CSP: Previously seen

Posterior Fossa:  Previously seen

Nuchal Region: Previously seen   NFT= not applicable

Upper Lip: Previously seen

Spine: Appears normal

4 Chamber Heart on Left: Appears normal

LVOT: Previously seen

RVOT: Appears normal

Stomach on Left: Appears normal

3 Vessel Cord: Previously seen

Cord Insertion site: Previously seen

Kidneys: Appears normal

Bladder: Appears normal

Extremities: Previously seen

Technically difficult due to: Not applicable

Maternal Findings:

Cervix:  3.5 centimeters on transabdominal evaluation.
IMPRESSION: 1. Today's estimated fetal weight is 35.2 percentile. Size and dates
correlate well.
2. No fetal anomalies are identified.
3. Normal amniotic fluid volume.

## 2022-12-31 IMAGING — US US EXTREM LOW VENOUS*R*
1 series · 14 of 24 positions shown · non-contrast
Comparison: None.

CLINICAL DATA: Right calf pain concern for DVT

EXAM:
Right LOWER EXTREMITY VENOUS DOPPLER ULTRASOUND
TECHNIQUE: Gray-scale sonography with compression, as well as color and duplex
ultrasound, were performed to evaluate the deep venous system(s)
from the level of the common femoral vein through the popliteal and
proximal calf veins.

[Series 1: us venous img lower uni right (dvt) · portal-venous · 44 acquisitions, 14 frames shown]
[im 1/44]
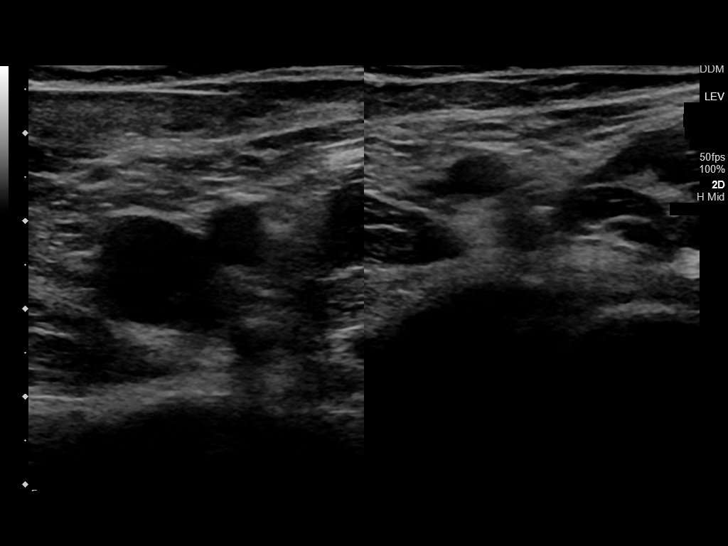
[im 4/44]
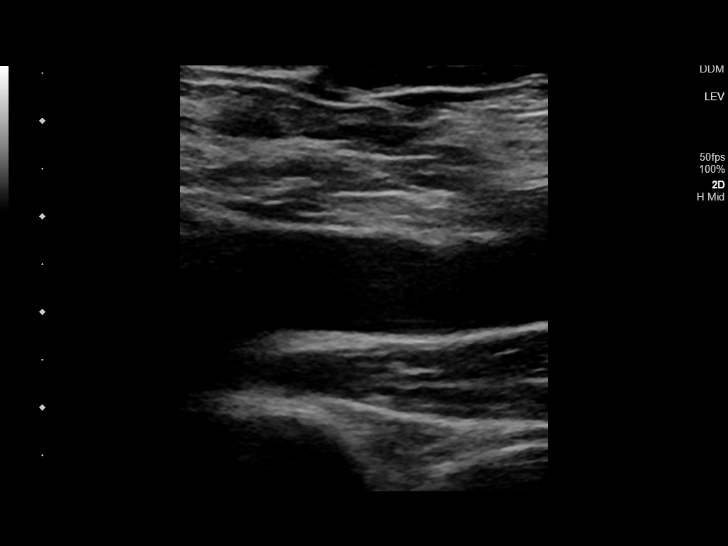
[im 8/44]
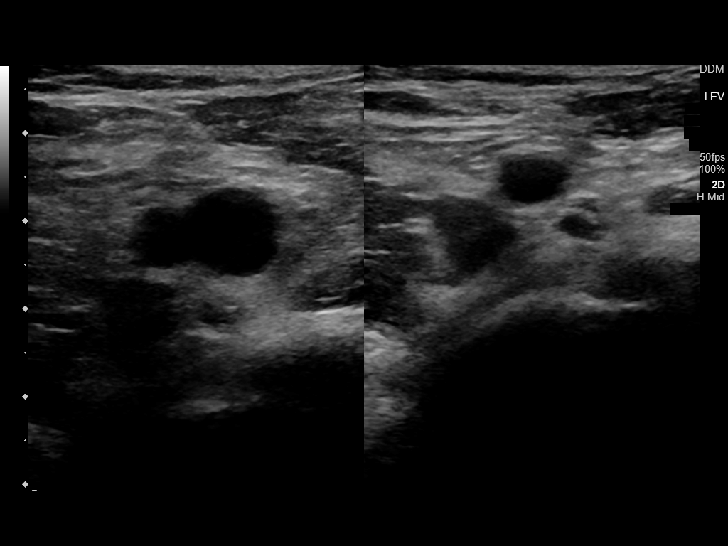
[im 12/44]
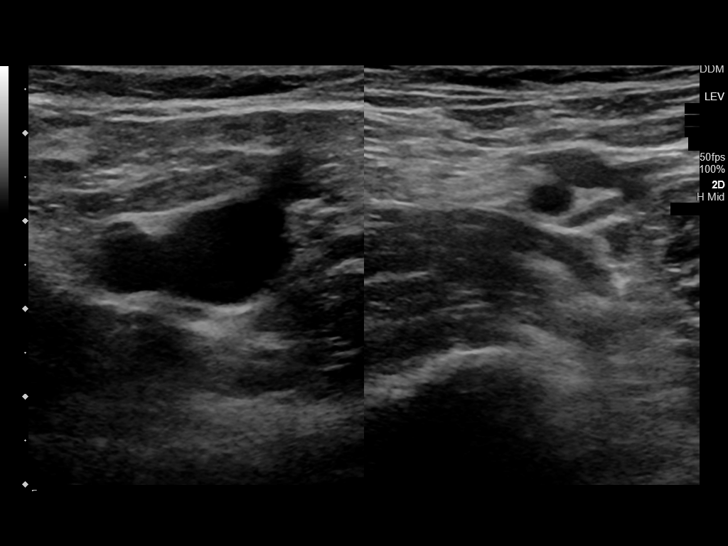
[im 14/44]
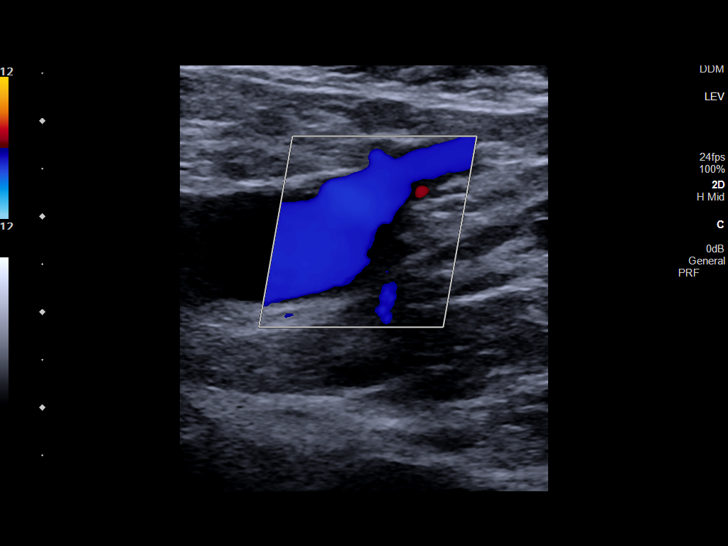
[im 17/44]
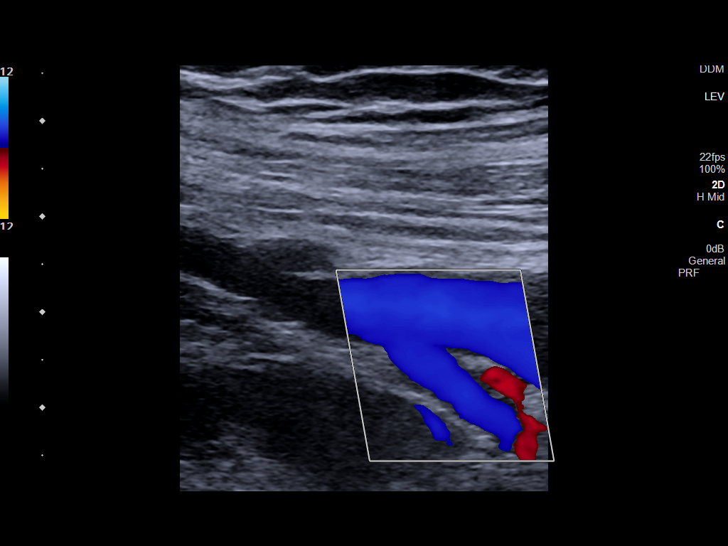
[im 21/44]
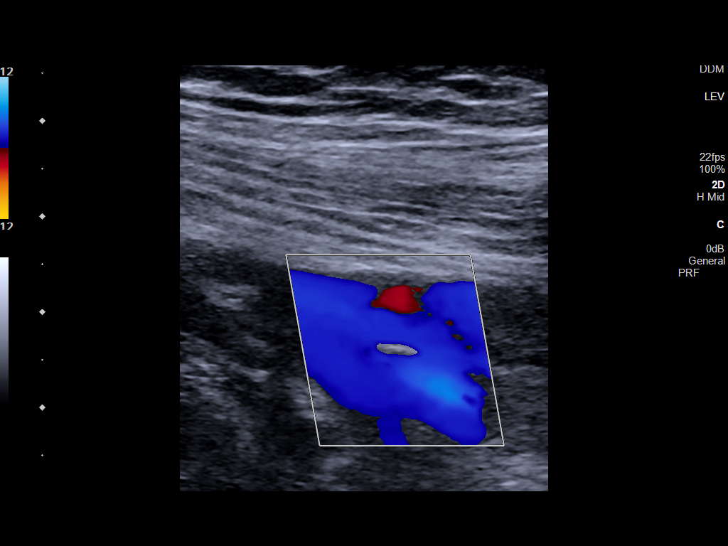
[im 25/44]
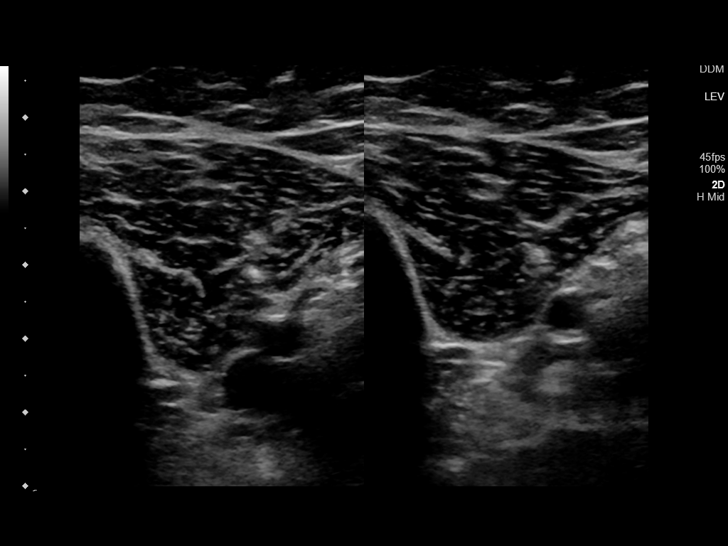
[im 29/44]
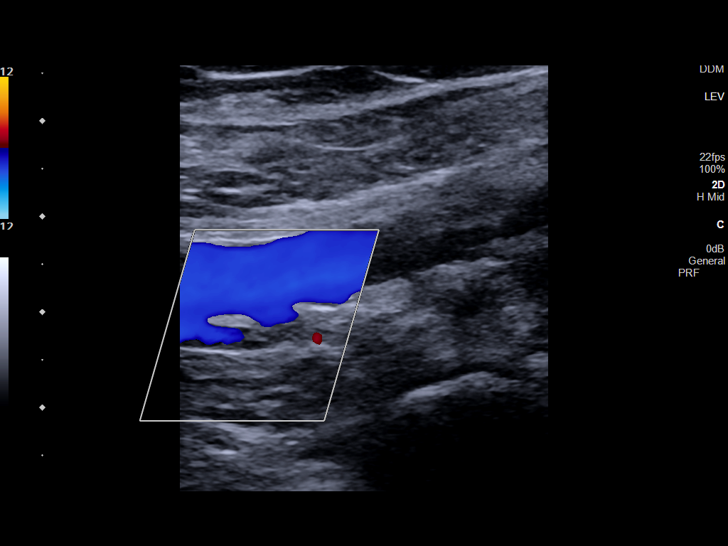
[im 32/44]
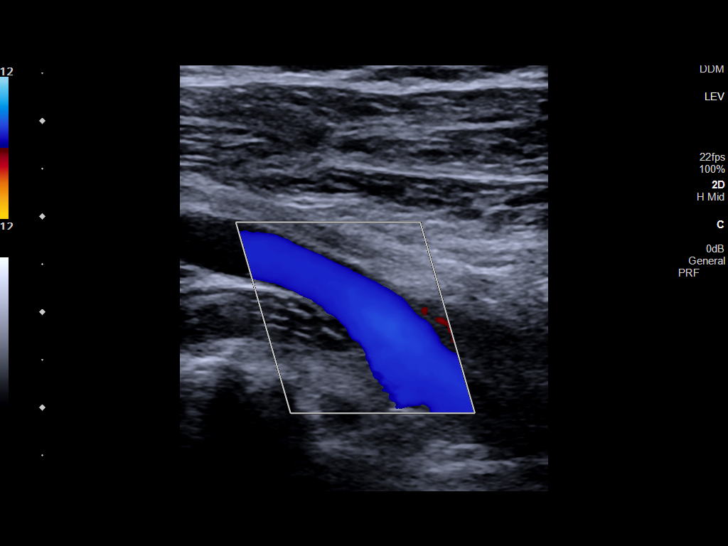
[im 36/44]
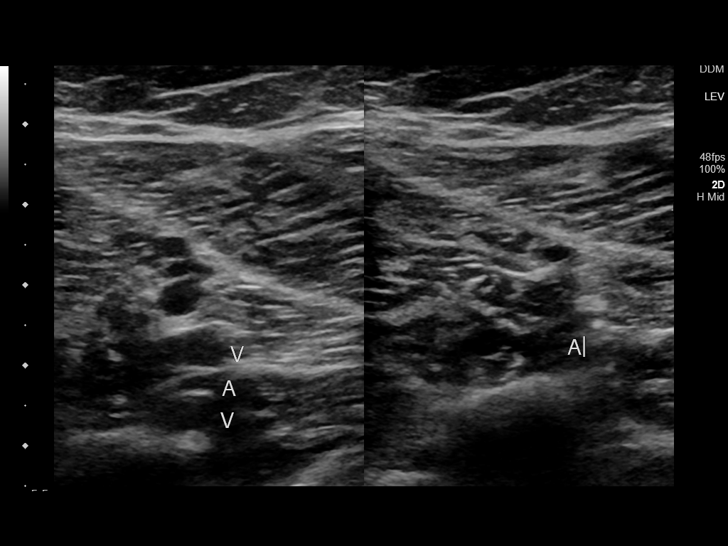
[im 38/44]
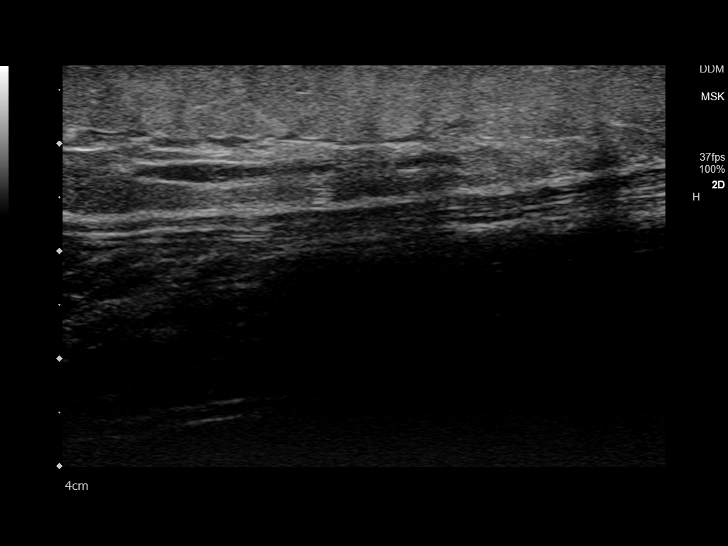
[im 42/44]
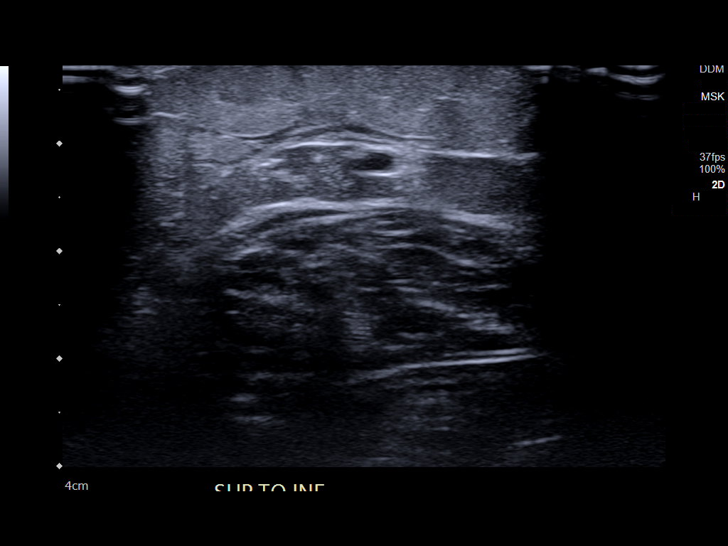
[im 44/44]
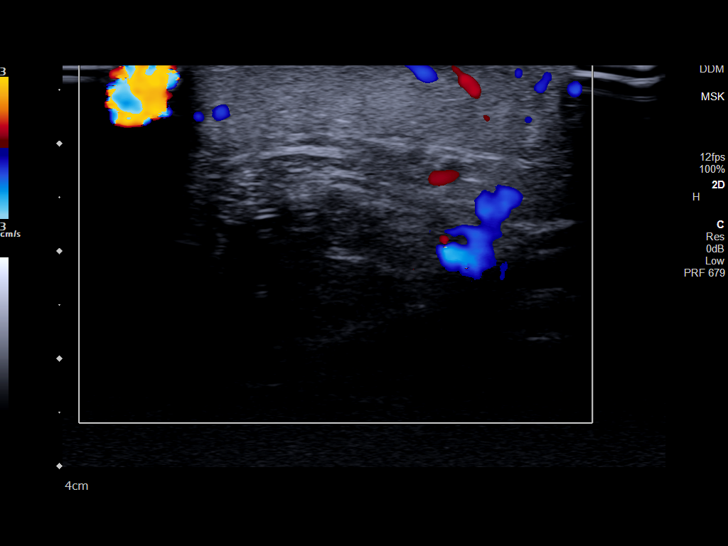

[14 of 24 positions shown; findings below may reference images not displayed]

FINDINGS: VENOUS

Normal compressibility of the common femoral, superficial femoral,
and popliteal veins, as well as the visualized calf veins.
Visualized portions of profunda femoral vein and great saphenous
vein unremarkable. No filling defects to suggest DVT on grayscale or
color Doppler imaging. Doppler waveforms show normal direction of
venous flow, normal respiratory plasticity and response to
augmentation.

Limited views of the contralateral common femoral vein are
unremarkable.

OTHER

None.

Limitations: none
IMPRESSION: Negative study for DVT.

## 2023-01-02 ENCOUNTER — Telehealth: Payer: Self-pay | Admitting: Family Medicine

## 2023-01-02 NOTE — Telephone Encounter (Signed)
[facility] - Client TELEPHONE ADVICE RECORD [facility] Patient Name: Alexis Bolton Gender: Female DOB: [birth year] Age: 27 Y 9 M 24 D Return Phone Number: [phone] (Primary) Address: City/ State/ Zip: [place] [address] Client [facility] - Client Client Site [place] Provider Tower, Roque Lias - MD Contact Type Call Who Is Calling Patient / Member / Family / Caregiver Call Type Triage / Clinical Relationship To Patient Self Return Phone Number [phone] (Primary) Chief Complaint Numbness Reason for Call Symptomatic / Request for Health Information Initial Comment Caller was transferred from the office. Patient is experiencing dizziness, shakiness, and her arm and legs are numb. Translation No Nurse Assessment Nurse: Purcell Nails, RN, [place] Date/Time (Eastern Time): 01/02/2023 10:19:37 AM Confirm and document reason for call. If symptomatic, describe symptoms. ---Caller states she is having intermittent dizziness, hand shakiness, constant numbness in bilateral legs that started last night. Does the patient have any new or worsening symptoms? ---Yes Will a triage be completed? ---Yes Related visit to physician within the last 2 weeks? ---No Does the PT have any chronic conditions? (i.e. diabetes, asthma, this includes High risk factors for pregnancy, etc.) ---No Is the patient pregnant or possibly pregnant? (Ask all females between the ages of 62-55) ---No Is this a behavioral health or substance abuse call? ---No Guidelines Guideline Title Affirmed Question Affirmed Notes Nurse Date/Time (Eastern Time) Dizziness - Lightheadedness [1] MODERATE dizziness (e.g., interferes with normal activities) AND [2] has NOT been evaluated by doctor (or NP/PA) for this (Exception: Dizziness caused by heat exposure, sudden Purcell Nails, RN, [place] 01/02/2023 10:22:54 AM PLEASE NOTE: All  timestamps contained within this report are represented as [place] Standard Time. CONFIDENTIALTY NOTICE: This fax transmission is intended only for the addressee. It contains information that is legally privileged, confidential or otherwise protected from use or disclosure. If you are not the intended recipient, you are strictly prohibited from reviewing, disclosing, copying using or disseminating any of this information or taking any action in reliance on or regarding this information. If you have received this fax in error, please notify us immediately by telephone so that we can arrange for its return to [place]. Phone: [phone], Toll-Free: [phone], Fax: [phone] Page: 2 of 2 Call Id: [id number] Guidelines Guideline Title Affirmed Question Affirmed Notes Nurse Date/Time Eilene Ghazi Time) standing, or poor fluid intake.) Disp. Time Eilene Ghazi Time) Disposition Final User 01/02/2023 10:30:20 AM See PCP within 24 Hours Yes Purcell Nails, RN, [place] Final Disposition 01/02/2023 10:30:20 AM See PCP within 24 Hours Yes Purcell Nails, RN, [place] Caller Disagree/Comply Comply Caller Understands Yes PreDisposition Go to Urgent Care/Walk-In Clinic Care Advice Given Per Guideline SEE PCP WITHIN 24 HOURS: * IF OFFICE WILL BE OPEN: You need to be examined within the next 24 hours. Call your doctor (or NP/PA) when the office opens and make an appointment. DRINK FLUIDS: * Drink several glasses of fruit juice, other clear fluids or water. LIE DOWN AND REST: * Lie down with feet elevated for 1 hour. CALL BACK IF: * You become worse * Passes out (faints) CARE ADVICE given per Dizziness (Adult) guideline. Referrals REFERRED TO PCP OFFIC

## 2023-01-02 NOTE — Telephone Encounter (Signed)
Aware and will see her then Agree with ER precautions

## 2023-01-02 NOTE — Telephone Encounter (Signed)
I Spoke with pt and pt said right now she only has shakiness and some numbness in both legs from upper leg to lower leg  but pt said she can walk. Pt does not have a way to ck her BS. Pt said she has eaten something and does not see a lot of change in symptoms. Pt said she has a one yr old and does not feel comfortable in taking her to UC or ED. Pt has already scheduled appt with Dr Glori Bickers on 01/04/23 by my chart. I offered pt an appt with a different provider before 01/04/23 but pt declined and will keep appt with Dr Glori Bickers on 01/04/23. UC & ED precautions given and pt voiced understanding. Pt said if she could get childcare when some of her family or friends get off work today and pt can get off work then pt may got to [facility] or ED if needed. Sending note to Dr Glori Bickers and [employer].

## 2023-01-02 NOTE — Telephone Encounter (Signed)
Patient called in stating that she is experiencing shaky and numb legs,and shaky arms and hands. She think that she is experiencing low blood sugar. She does not have diabetes,however it does run in her family. Due to her symptoms,she was sent to access nurse to be triaged.

## 2023-01-04 ENCOUNTER — Encounter: Payer: Self-pay | Admitting: Family Medicine

## 2023-01-04 ENCOUNTER — Ambulatory Visit (INDEPENDENT_AMBULATORY_CARE_PROVIDER_SITE_OTHER): Payer: BC Managed Care – PPO | Admitting: Family Medicine

## 2023-01-04 VITALS — BP 108/60 | HR 97 | Temp 98.2°F | Ht 61.0 in | Wt 113.0 lb

## 2023-01-04 DIAGNOSIS — R251 Tremor, unspecified: Secondary | ICD-10-CM | POA: Diagnosis not present

## 2023-01-04 DIAGNOSIS — E611 Iron deficiency: Secondary | ICD-10-CM | POA: Diagnosis not present

## 2023-01-04 DIAGNOSIS — R202 Paresthesia of skin: Secondary | ICD-10-CM | POA: Diagnosis not present

## 2023-01-04 DIAGNOSIS — R42 Dizziness and giddiness: Secondary | ICD-10-CM | POA: Diagnosis not present

## 2023-01-04 DIAGNOSIS — E559 Vitamin D deficiency, unspecified: Secondary | ICD-10-CM

## 2023-01-04 LAB — TSH: TSH: 1.25 u[IU]/mL (ref 0.35–5.50)

## 2023-01-04 LAB — CBC WITH DIFFERENTIAL/PLATELET
Basophils Absolute: 0 10*3/uL (ref 0.0–0.1)
Basophils Relative: 0.5 % (ref 0.0–3.0)
Eosinophils Absolute: 0.1 10*3/uL (ref 0.0–0.7)
Eosinophils Relative: 1.9 % (ref 0.0–5.0)
HCT: 38 % (ref 36.0–46.0)
Hemoglobin: 13 g/dL (ref 12.0–15.0)
Lymphocytes Relative: 31.4 % (ref 12.0–46.0)
Lymphs Abs: 2.3 10*3/uL (ref 0.7–4.0)
MCHC: 34.3 g/dL (ref 30.0–36.0)
MCV: 89.4 fl (ref 78.0–100.0)
Monocytes Absolute: 0.5 10*3/uL (ref 0.1–1.0)
Monocytes Relative: 6.7 % (ref 3.0–12.0)
Neutro Abs: 4.3 10*3/uL (ref 1.4–7.7)
Neutrophils Relative %: 59.5 % (ref 43.0–77.0)
Platelets: 276 10*3/uL (ref 150.0–400.0)
RBC: 4.25 Mil/uL (ref 3.87–5.11)
RDW: 14.6 % (ref 11.5–15.5)
WBC: 7.2 10*3/uL (ref 4.0–10.5)

## 2023-01-04 LAB — COMPREHENSIVE METABOLIC PANEL
ALT: 11 U/L (ref 0–35)
AST: 12 U/L (ref 0–37)
Albumin: 4.6 g/dL (ref 3.5–5.2)
Alkaline Phosphatase: 42 U/L (ref 39–117)
BUN: 8 mg/dL (ref 6–23)
CO2: 30 mEq/L (ref 19–32)
Calcium: 9.1 mg/dL (ref 8.4–10.5)
Chloride: 102 mEq/L (ref 96–112)
Creatinine, Ser: 0.61 mg/dL (ref 0.40–1.20)
GFR: 125.15 mL/min (ref >60.00)
Glucose, Bld: 86 mg/dL (ref 70–99)
Potassium: 4.1 mEq/L (ref 3.5–5.1)
Sodium: 138 mEq/L (ref 135–145)
Total Bilirubin: 0.4 mg/dL (ref 0.2–1.2)
Total Protein: 6.8 g/dL (ref 6.0–8.3)

## 2023-01-04 LAB — VITAMIN B12: Vitamin B-12: 465 pg/mL (ref 211–911)

## 2023-01-04 LAB — VITAMIN D 25 HYDROXY (VIT D DEFICIENCY, FRACTURES): VITD: 17.39 ng/mL — ABNORMAL LOW (ref 30.00–100.00)

## 2023-01-04 LAB — IRON: Iron: 37 ug/dL — ABNORMAL LOW (ref 42–145)

## 2023-01-04 LAB — FERRITIN: Ferritin: 7.3 ng/mL — ABNORMAL LOW (ref 10.0–291.0)

## 2023-01-04 MED ORDER — IRON (FERROUS SULFATE) 325 (65 FE) MG PO TABS: 325.0000 mg | ORAL_TABLET | Freq: Every day | ORAL | 1 refills | Status: DC

## 2023-01-04 MED ORDER — ERGOCALCIFEROL 1.25 MG (50000 UT) PO CAPS: 50000.0000 [IU] | ORAL_CAPSULE | ORAL | 0 refills | Status: AC

## 2023-01-04 NOTE — Assessment & Plan Note (Signed)
New today Lab Results  Component Value Date   IRON 37 (L) 01/04/2023   FERRITIN 7.3 (L) 01/04/2023    Sent in ferrous sulfate 325 mg daily  F/u in 2-3 wk

## 2023-01-04 NOTE — Patient Instructions (Signed)
Labs today   If symptoms worsen let us know  If severe go to the ER   We will make a plan from there

## 2023-01-04 NOTE — Progress Notes (Signed)
Subjective:    Patient ID: Alexis Bolton, female    DOB: [birth year], 27 y.o.   MRN: [id number]  HPI Pt presents for c/o numbness, shaky hands, and dizziness  Wt Readings from Last 3 Encounters:  01/04/23 113 lb (51.3 kg)  11/22/22 112 lb 6.4 oz (51 kg)  10/24/22 110 lb 12.8 oz (50.3 kg)   21.35 kg/m  Vitals:   01/04/23 0758  BP: 108/60  Pulse: 97  Temp: 98.2 F (36.8 C)  SpO2: 99%   Started Monday night  She was hungry and thought blood sugar was low  Ate and drank OJ and Coke and did not feel better , and took an iron supplement   Tingling like legs are asleep but she can move  All day unless she is asleep Arms tingle a bit Fingers shake  A little light headed / may feel a bit spinny   Did get a headache- woke up with it   No weakness or loss of strength  Coordination was off briefly yesterday   No falls No head injuries   No new medicines   IUD for 2-3 months- and tolerates fine Has not stopped her period yet    Had the flu during xmas- got over it without tamiflu  No fever   Fam hx Grandfather had a stroke  GM has dementia    Starting new job  2 shifts so far  Likes it so far   Parents take care of the baby when she is at work   Patient Active Problem List   Diagnosis Date Noted   Paresthesias 01/04/2023   Dizziness 01/04/2023   Tremor 01/04/2023   Recurrent tonsillitis 10/05/2022   Diarrhea 05/09/2022   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   Contraception management 03/21/2022   Sore throat 02/22/2022   [redacted] weeks gestation of pregnancy 08/06/2021   Postpartum care following vaginal delivery 08/06/2021   Encounter for care or examination of lactating mother 08/06/2021   Normal labor and delivery 08/04/2021   Acute knee pain 04/12/2021   Supervision of normal first pregnancy 02/24/2021   Seasonal allergic rhinitis 07/31/2016   Past Medical History:  Diagnosis Date   Asthma    Eczema    Past Surgical History:  Procedure Laterality  Date   NO PAST SURGERIES     Social History   Tobacco Use   Smoking status: Never    Passive exposure: Never   Smokeless tobacco: Never  Substance Use Topics   Alcohol use: No    Alcohol/week: 0.0 standard drinks of alcohol   Drug use: No   Family History  Family history unknown: Yes   Allergies  Allergen Reactions   Other     Nuts, spring mix causes itchy throat   Penicillins    Pomegranate [[place]]     Itchy throat   Current Outpatient Medications on File Prior to Visit  Medication Sig Dispense Refill   acetaminophen (TYLENOL) 325 MG tablet Take 650 mg by mouth every 6 (six) hours as needed.     azelastine (ASTELIN) 0.1 % nasal spray as needed.     EPINEPHrine 0.3 mg/0.3 mL IJ SOAJ injection See admin instructions.     fluticasone (FLONASE) 50 MCG/ACT nasal spray as needed.     levocetirizine (XYZAL) 5 MG tablet SMARTSIG:1 Tablet(s) By Mouth Every Evening     montelukast (SINGULAIR) 10 MG tablet Take 1 tablet by mouth at bedtime.     norethindrone-ethinyl estradiol-FE (JUNEL FE 1/20) 1-20 MG-MCG  tablet Take 1 tablet by mouth daily. 28 tablet 11   sertraline (ZOLOFT) 50 MG tablet Take 1 tablet (50 mg total) by mouth daily. 90 tablet 3   No current facility-administered medications on file prior to visit.    Review of Systems  Constitutional:  Positive for fatigue. Negative for activity change, appetite change, fever and unexpected weight change.  HENT:  Negative for congestion, ear pain, rhinorrhea, sinus pressure and sore throat.   Eyes:  Negative for pain, redness and visual disturbance.  Respiratory:  Negative for cough, shortness of breath and wheezing.   Cardiovascular:  Negative for chest pain and palpitations.  Gastrointestinal:  Negative for abdominal pain, blood in stool, constipation and diarrhea.  Endocrine: Negative for polydipsia and polyuria.  Genitourinary:  Negative for dysuria, frequency and urgency.  Musculoskeletal:  Negative for arthralgias, back  pain and myalgias.  Skin:  Negative for pallor and rash.  Allergic/Immunologic: Negative for environmental allergies.  Neurological:  Positive for dizziness, tremors, light-headedness and numbness. Negative for seizures, syncope, facial asymmetry, speech difficulty, weakness and headaches.  Hematological:  Negative for adenopathy. Does not bruise/bleed easily.  Psychiatric/Behavioral:  Negative for decreased concentration and dysphoric mood. The patient is not nervous/anxious.        Stressors        Objective:   Physical Exam Constitutional:      General: She is not in acute distress.    Appearance: Normal appearance. She is well-developed and normal weight. She is not ill-appearing or diaphoretic.  HENT:     Head: Normocephalic and atraumatic.     Right Ear: External ear normal.     Left Ear: External ear normal.     Nose: Nose normal.     Mouth/Throat:     Pharynx: No oropharyngeal exudate.  Eyes:     General: No scleral icterus.       Right eye: No discharge.        Left eye: No discharge.     Conjunctiva/sclera: Conjunctivae normal.     Pupils: Pupils are equal, round, and reactive to light.     Comments: No nystagmus  Neck:     Thyroid: No thyromegaly.     Vascular: No carotid bruit or JVD.     Trachea: No tracheal deviation.  Cardiovascular:     Rate and Rhythm: Normal rate and regular rhythm.     Heart sounds: Normal heart sounds. No murmur heard. Pulmonary:     Effort: Pulmonary effort is normal. No respiratory distress.     Breath sounds: Normal breath sounds. No wheezing or rales.  Abdominal:     General: Bowel sounds are normal. There is no distension.     Palpations: Abdomen is soft. There is no mass.     Tenderness: There is no abdominal tenderness.  Musculoskeletal:        General: No tenderness.     Cervical back: Full passive range of motion without pain, normal range of motion and neck supple.  Lymphadenopathy:     Cervical: No cervical adenopathy.   Skin:    General: Skin is warm and dry.     Coloration: Skin is not pale.     Findings: No rash.  Neurological:     Mental Status: She is alert and oriented to person, place, and time.     Cranial Nerves: No cranial nerve deficit, dysarthria or facial asymmetry.     Sensory: Sensation is intact. No sensory deficit.     Motor:  No weakness, tremor, atrophy, abnormal muscle tone, seizure activity or pronator drift.     Coordination: Romberg sign negative. Coordination normal. Finger-Nose-Finger Test normal.     Gait: Gait is intact. Gait and tandem walk normal.     Deep Tendon Reflexes: Reflexes are normal and symmetric. Reflexes normal.     Comments: No focal cerebellar signs    Able to feel light touch/cotton hands and feet   Interp cool metal as warm in feet   Psychiatric:        Behavior: Behavior normal.        Thought Content: Thought content normal.     Comments: Candidly discusses symptoms and stressors   Does not seem overly anxious            Assessment & Plan:   Problem List Items Addressed This Visit       Other   Dizziness    More light headed than spinning Not orthostatic  Reassuring neuro exam Also has some paresthesias  Stress may play a role    Labs pending  Enc better fluids/regular meals        Relevant Orders   Comprehensive metabolic panel   CBC with Differential/Platelet   TSH   VITAMIN D 25 Hydroxy (Vit-D Deficiency, Fractures)   Iron   Ferritin   Vitamin B12   Paresthesias - Primary    Mostly legs/occ arms Symmetric No loss of strength  Some shakiness and vague dizziness  No imp with eating  Reassuring  non focal neuro exam   Labs pending  Enc to eat regularly  Alert if worse ER precautions  ? If stress or anxiety play a role      Relevant Orders   Comprehensive metabolic panel   CBC with Differential/Platelet   TSH   VITAMIN D 25 Hydroxy (Vit-D Deficiency, Fractures)   Iron   Ferritin   Vitamin B12   Tremor     Assoc with vague dizziness and paresthesias  Mild on exam / symmetric and intentional  Disc caffeine  Disc stressors (? If a factor) Labs pending       Relevant Orders   Comprehensive metabolic panel   CBC with Differential/Platelet   TSH   VITAMIN D 25 Hydroxy (Vit-D Deficiency, Fractures)   Iron   Ferritin   Vitamin B12

## 2023-01-04 NOTE — Assessment & Plan Note (Signed)
Assoc with vague dizziness and paresthesias  Mild on exam / symmetric and intentional  Disc caffeine  Disc stressors (? If a factor) Labs pending

## 2023-01-04 NOTE — Assessment & Plan Note (Signed)
More light headed than spinning Not orthostatic  Reassuring neuro exam Also has some paresthesias  Stress may play a role    Labs pending  Enc better fluids/regular meals

## 2023-01-04 NOTE — Assessment & Plan Note (Addendum)
Mostly legs/occ arms Symmetric No loss of strength  Some shakiness and vague dizziness  No imp with eating  Reassuring  non focal neuro exam   Labs pending  Enc to eat regularly  Alert if worse ER precautions  ? If stress or anxiety play a role

## 2023-01-04 NOTE — Assessment & Plan Note (Addendum)
New today  Level of 17.39 May be adding to symptoms Sent in high dose weekly d for 12 weeks Also inst to take 2000 iu D3 daily during and after that

## 2023-01-04 NOTE — Addendum Note (Signed)
Addended by: Loura Pardon A on: 01/04/2023 04:14 PM   Modules accepted: Orders

## 2023-01-17 ENCOUNTER — Encounter: Payer: Self-pay | Admitting: Family Medicine

## 2023-01-17 NOTE — Telephone Encounter (Signed)
Please schedule in office appt with first available Give ER precautions as well

## 2023-01-18 NOTE — Telephone Encounter (Signed)
LVM for patient to callback and schedule an OV.

## 2023-01-18 NOTE — Telephone Encounter (Signed)
ER precaution given on mychart message. Please schedule pt an appt

## 2023-01-25 ENCOUNTER — Encounter: Payer: Self-pay | Admitting: Family Medicine

## 2023-01-25 ENCOUNTER — Ambulatory Visit (INDEPENDENT_AMBULATORY_CARE_PROVIDER_SITE_OTHER): Payer: BC Managed Care – PPO | Admitting: Family Medicine

## 2023-01-25 VITALS — BP 128/78 | HR 108 | Temp 98.1°F | Ht 61.0 in | Wt 109.2 lb

## 2023-01-25 DIAGNOSIS — R202 Paresthesia of skin: Secondary | ICD-10-CM

## 2023-01-25 DIAGNOSIS — R42 Dizziness and giddiness: Secondary | ICD-10-CM | POA: Diagnosis not present

## 2023-01-25 DIAGNOSIS — E559 Vitamin D deficiency, unspecified: Secondary | ICD-10-CM

## 2023-01-25 DIAGNOSIS — E611 Iron deficiency: Secondary | ICD-10-CM

## 2023-01-25 DIAGNOSIS — R251 Tremor, unspecified: Secondary | ICD-10-CM

## 2023-01-25 LAB — CBC WITH DIFFERENTIAL/PLATELET
Basophils Absolute: 0 10*3/uL (ref 0.0–0.1)
Basophils Relative: 0.6 % (ref 0.0–3.0)
Eosinophils Absolute: 0.1 10*3/uL (ref 0.0–0.7)
Eosinophils Relative: 1.9 % (ref 0.0–5.0)
HCT: 39.6 % (ref 36.0–46.0)
Hemoglobin: 13.6 g/dL (ref 12.0–15.0)
Lymphocytes Relative: 30.8 % (ref 12.0–46.0)
Lymphs Abs: 2.1 10*3/uL (ref 0.7–4.0)
MCHC: 34.3 g/dL (ref 30.0–36.0)
MCV: 88.6 fl (ref 78.0–100.0)
Monocytes Absolute: 0.4 10*3/uL (ref 0.1–1.0)
Monocytes Relative: 6.5 % (ref 3.0–12.0)
Neutro Abs: 4.1 10*3/uL (ref 1.4–7.7)
Neutrophils Relative %: 60.2 % (ref 43.0–77.0)
Platelets: 382 10*3/uL (ref 150.0–400.0)
RBC: 4.47 Mil/uL (ref 3.87–5.11)
RDW: 14.1 % (ref 11.5–15.5)
WBC: 6.8 10*3/uL (ref 4.0–10.5)

## 2023-01-25 LAB — IRON: Iron: 101 ug/dL (ref 42–145)

## 2023-01-25 LAB — FERRITIN: Ferritin: 14.4 ng/mL (ref 10.0–291.0)

## 2023-01-25 NOTE — Assessment & Plan Note (Signed)
No longer shaky with tx of iron and D and less caffeine

## 2023-01-25 NOTE — Progress Notes (Signed)
Subjective:    Patient ID: Alexis Bolton, female    DOB: May 19, 1996, 27 y.o.   MRN: [id number]  HPI Pt presents for f/u of dizziness and paresthesias and tremor  Also thinks she had a case of food poisoning   Wt Readings from Last 3 Encounters:  01/25/23 109 lb 4 oz (49.6 kg)  01/04/23 113 lb (51.3 kg)  11/22/22 112 lb 6.4 oz (51 kg)   20.64 kg/m  Vitals:   01/25/23 1000  BP: 128/78  Pulse: (!) 108  Temp: 98.1 F (36.7 C)  SpO2: 98%    Had a brief episode of n/v/d after eating a sandwich that did not taste right  2 hours and better   We did labs at last visit  Discussed red caffeine Discussed stressors   Dizziness / tremor and paresthesias are much improved Has a moment here and there if she does not eat  Otherwise 100% better   Lab Results  Component Value Date   CREATININE 0.61 01/04/2023   BUN 8 01/04/2023   NA 138 01/04/2023   K 4.1 01/04/2023   CL 102 01/04/2023   CO2 30 01/04/2023   Lab Results  Component Value Date   ALT 11 01/04/2023   AST 12 01/04/2023   ALKPHOS 42 01/04/2023   BILITOT 0.4 01/04/2023   Lab Results  Component Value Date   TSH 1.25 01/04/2023   Vit D was low at 17.39  Sent in high dose ergocalciferol   Lab Results  Component Value Date   WBC 7.2 01/04/2023   HGB 13.0 01/04/2023   HCT 38.0 01/04/2023   MCV 89.4 01/04/2023   PLT 276.0 01/04/2023   Lab Results  Component Value Date   IRON 37 (L) 01/04/2023   FERRITIN 7.3 (L) 01/04/2023    Iron def  Sent in ferrous sulfate 325 mg daily  Tolerates it well  No constipation   Period was heavy this week      Lab Results  Component Value Date   VITAMINB12 465 01/04/2023    Patient Active Problem List   Diagnosis Date Noted   Iron deficiency 01/04/2023   Vitamin D deficiency 01/04/2023   Recurrent tonsillitis 10/05/2022   Diarrhea 05/09/2022   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   Contraception management 03/21/2022   Sore throat 02/22/2022   [redacted] weeks  gestation of pregnancy 08/06/2021   Postpartum care following vaginal delivery 08/06/2021   Encounter for care or examination of lactating mother 08/06/2021   Normal labor and delivery 08/04/2021   Acute knee pain 04/12/2021   Supervision of normal first pregnancy 02/24/2021   Seasonal allergic rhinitis 07/31/2016   Past Medical History:  Diagnosis Date   Asthma    Eczema    Past Surgical History:  Procedure Laterality Date   NO PAST SURGERIES     Social History   Tobacco Use   Smoking status: Never    Passive exposure: Never   Smokeless tobacco: Never  Substance Use Topics   Alcohol use: No    Alcohol/week: 0.0 standard drinks of alcohol   Drug use: No   Family History  Family history unknown: Yes   Allergies  Allergen Reactions   Other     Nuts, spring mix causes itchy throat   Penicillins    Pomegranate [[place]]     Itchy throat   Current Outpatient Medications on File Prior to Visit  Medication Sig Dispense Refill   acetaminophen (TYLENOL) 325 MG tablet Take 650 mg  by mouth every 6 (six) hours as needed.     azelastine (ASTELIN) 0.1 % nasal spray as needed.     EPINEPHrine 0.3 mg/0.3 mL IJ SOAJ injection See admin instructions.     ergocalciferol (VITAMIN D2) 1.25 MG (50000 UT) capsule Take 1 capsule (50,000 Units total) by mouth once a week. 12 capsule 0   fluticasone (FLONASE) 50 MCG/ACT nasal spray as needed.     Iron, Ferrous Sulfate, 325 (65 Fe) MG TABS Take 325 mg by mouth daily. With food 30 tablet 1   levocetirizine (XYZAL) 5 MG tablet SMARTSIG:1 Tablet(s) By Mouth Every Evening     montelukast (SINGULAIR) 10 MG tablet Take 1 tablet by mouth at bedtime.     norethindrone-ethinyl estradiol-FE (JUNEL FE 1/20) 1-20 MG-MCG tablet Take 1 tablet by mouth daily. 28 tablet 11   sertraline (ZOLOFT) 50 MG tablet Take 1 tablet (50 mg total) by mouth daily. 90 tablet 3   No current facility-administered medications on file prior to visit.    Review of Systems   Constitutional:  Negative for activity change, appetite change, fatigue, fever and unexpected weight change.  HENT:  Negative for congestion, ear pain, rhinorrhea, sinus pressure and sore throat.   Eyes:  Negative for pain, redness and visual disturbance.  Respiratory:  Negative for cough, shortness of breath and wheezing.   Cardiovascular:  Negative for chest pain and palpitations.  Gastrointestinal:  Negative for abdominal pain, blood in stool, constipation and diarrhea.  Endocrine: Negative for polydipsia and polyuria.  Genitourinary:  Negative for dysuria, frequency and urgency.  Musculoskeletal:  Negative for arthralgias, back pain and myalgias.  Skin:  Negative for pallor and rash.  Allergic/Immunologic: Negative for environmental allergies.  Neurological:  Negative for dizziness, syncope and headaches.  Hematological:  Negative for adenopathy. Does not bruise/bleed easily.  Psychiatric/Behavioral:  Negative for decreased concentration and dysphoric mood. The patient is not nervous/anxious.        Objective:   Physical Exam Constitutional:      General: She is not in acute distress.    Appearance: Normal appearance. She is well-developed and normal weight. She is not ill-appearing or diaphoretic.  HENT:     Head: Normocephalic and atraumatic.  Eyes:     Conjunctiva/sclera: Conjunctivae normal.     Pupils: Pupils are equal, round, and reactive to light.  Neck:     Thyroid: No thyromegaly.     Vascular: No carotid bruit or JVD.  Cardiovascular:     Rate and Rhythm: Normal rate and regular rhythm.     Heart sounds: Normal heart sounds.     No gallop.  Pulmonary:     Effort: Pulmonary effort is normal. No respiratory distress.     Breath sounds: Normal breath sounds. No wheezing or rales.  Abdominal:     General: There is no distension or abdominal bruit.     Palpations: Abdomen is soft.  Musculoskeletal:     Cervical back: Normal range of motion and neck supple.      Right lower leg: No edema.     Left lower leg: No edema.  Lymphadenopathy:     Cervical: No cervical adenopathy.  Skin:    General: Skin is warm and dry.     Coloration: Skin is not pale.     Findings: No rash.  Neurological:     Mental Status: She is alert.     Cranial Nerves: No cranial nerve deficit or facial asymmetry.     Sensory:  No sensory deficit.     Motor: No weakness or tremor.     Coordination: Coordination normal.     Deep Tendon Reflexes: Reflexes are normal and symmetric. Reflexes normal.  Psychiatric:        Mood and Affect: Mood normal.           Assessment & Plan:   Problem List Items Addressed This Visit       Other   RESOLVED: Dizziness    Resolved with supplementation of iron and vit D      Iron deficiency - Primary    Lab Results  Component Value Date   IRON 37 (L) 01/04/2023   FERRITIN 7.3 (L) 01/04/2023  Since starting ferrous sulfate 325 mg daily feels much better Still some heavy menses-has IUD and giving that more time   Lab today Adj tx if needed  Tolerates well  No longer dizzy or parethesias       Relevant Orders   CBC with Differential/Platelet   Iron   Ferritin   RESOLVED: Paresthesias    Improved with tx of iron and D def      RESOLVED: Tremor    No longer shaky with tx of iron and D and less caffeine       Vitamin D deficiency    Level of 17.39 Disc imp to bone and overall health  Now taking high dose ergocalciferol weekly (12 wk) Then 2000 iu daily ongoing

## 2023-01-25 NOTE — Assessment & Plan Note (Signed)
Improved with tx of iron and D def

## 2023-01-25 NOTE — Patient Instructions (Signed)
Lab work today for iron   Keep taking it and I will change dose if needed   Continue the vitamin D So glad you are feeling better!  Take to your gyn about periods

## 2023-01-25 NOTE — Assessment & Plan Note (Signed)
Level of 17.39 Disc imp to bone and overall health  Now taking high dose ergocalciferol weekly (12 wk) Then 2000 iu daily ongoing

## 2023-01-25 NOTE — Assessment & Plan Note (Signed)
Resolved with supplementation of iron and vit D

## 2023-01-25 NOTE — Assessment & Plan Note (Signed)
Lab Results  Component Value Date   IRON 37 (L) 01/04/2023   FERRITIN 7.3 (L) 01/04/2023   Since starting ferrous sulfate 325 mg daily feels much better Still some heavy menses-has IUD and giving that more time   Lab today Adj tx if needed  Tolerates well  No longer dizzy or parethesias

## 2023-01-27 ENCOUNTER — Other Ambulatory Visit: Payer: Self-pay | Admitting: Family Medicine

## 2023-02-09 IMAGING — US US OB FOLLOW-UP
1 series · 15 of 28 positions shown · non-contrast
Comparison: none

CLINICAL DATA: 24-year-old gravida 1 para 0. Assigned EDC is
08/09/2021. Uterine size and dates discrepancy.

EXAM:
OBSTETRICAL ULTRASOUND >14 WKS

[Series 1: us ob follow up · 15 of 32 slices shown]
[im 1/32]
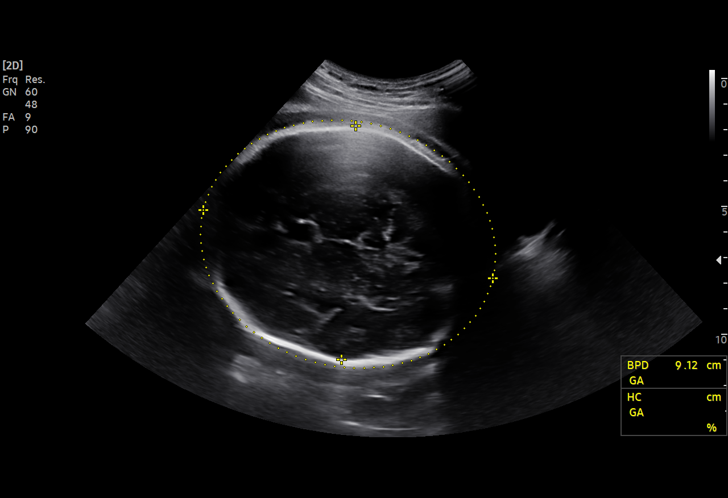
[im 3/32]
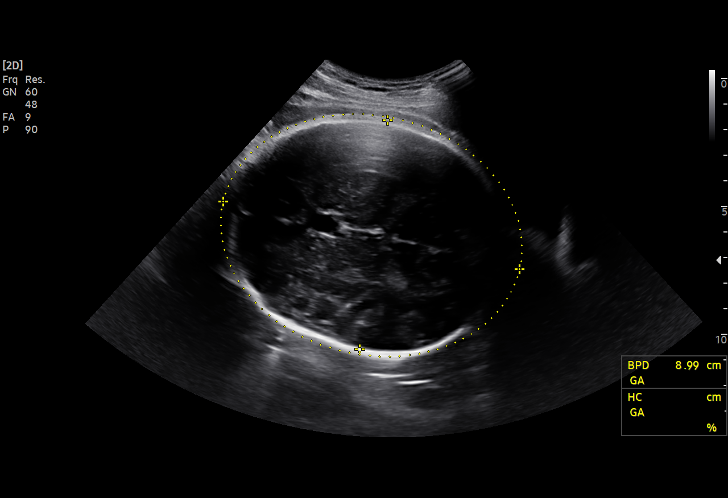
[im 5/32]
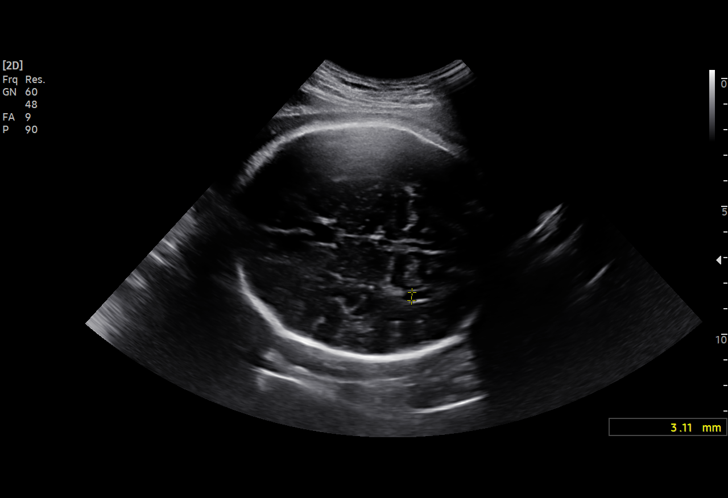
[im 7/32]
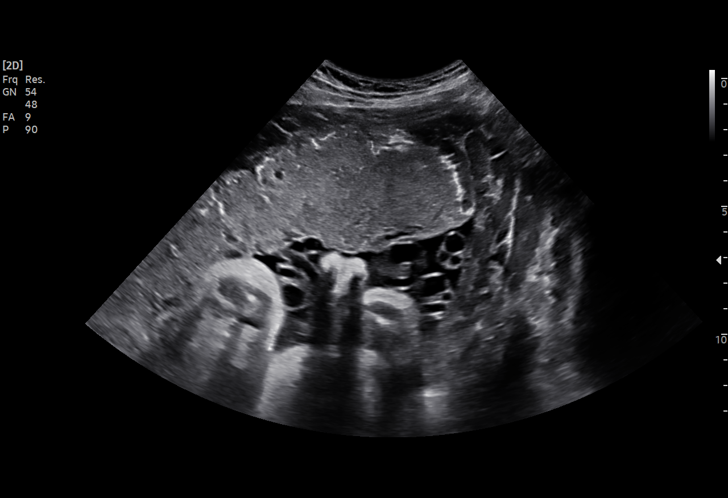
[im 10/32]
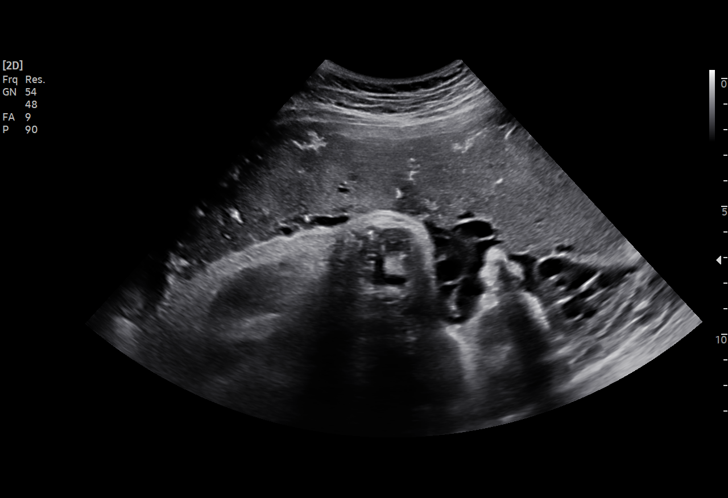
[im 12/32]
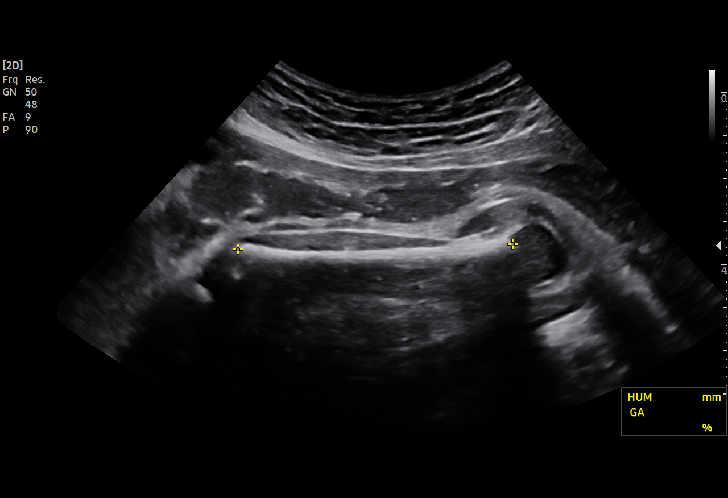
[im 14/32]
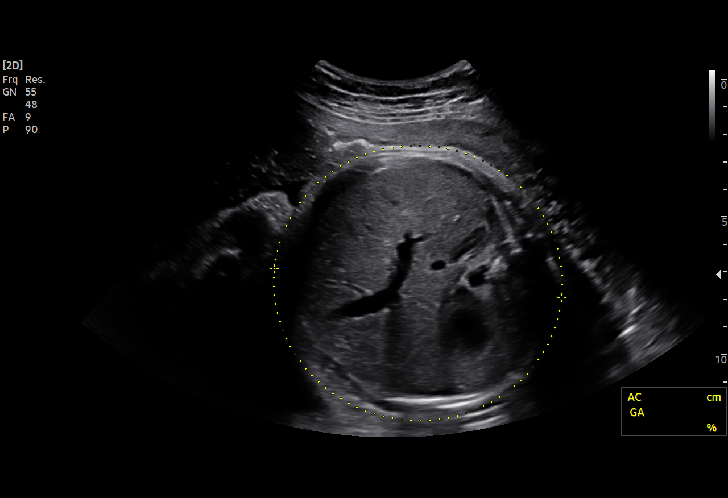
[im 17/32]
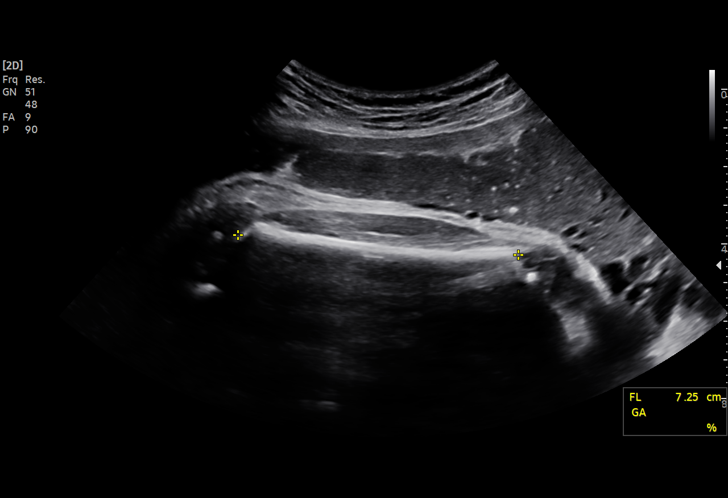
[im 18/32]
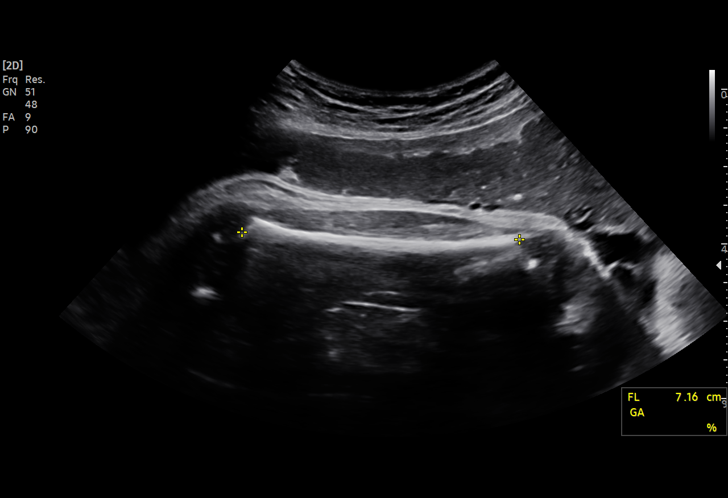
[im 20/32]
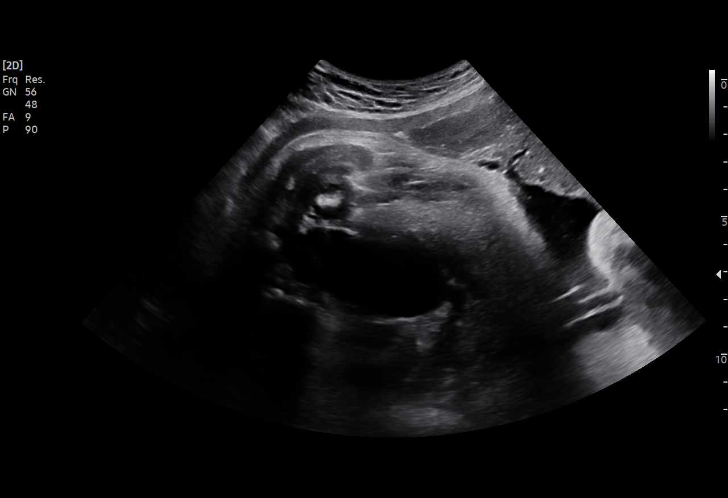
[im 22/32]
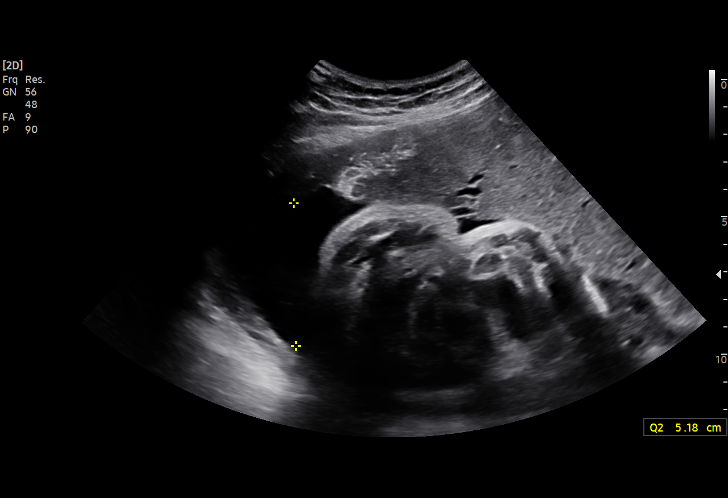
[im 25/32]
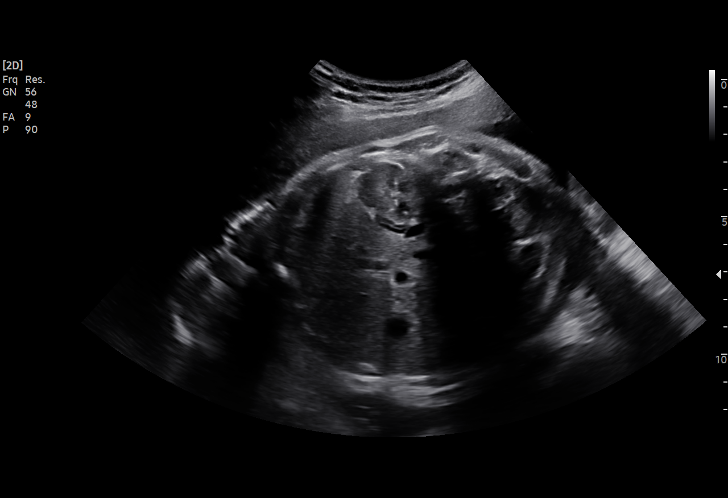
[im 27/32]
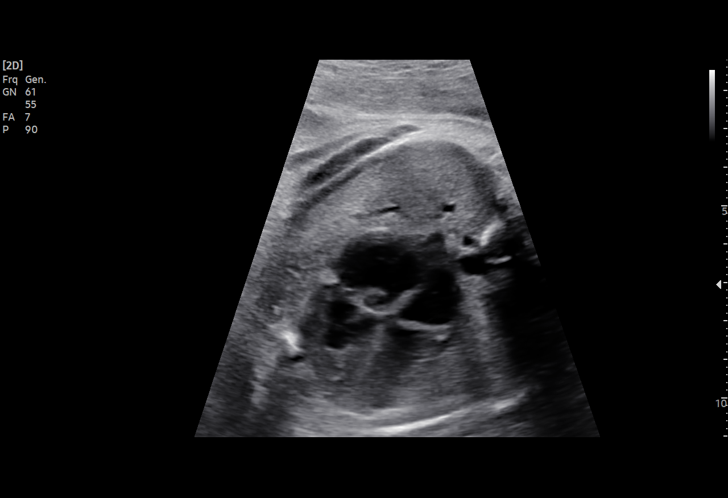
[im 29/32]
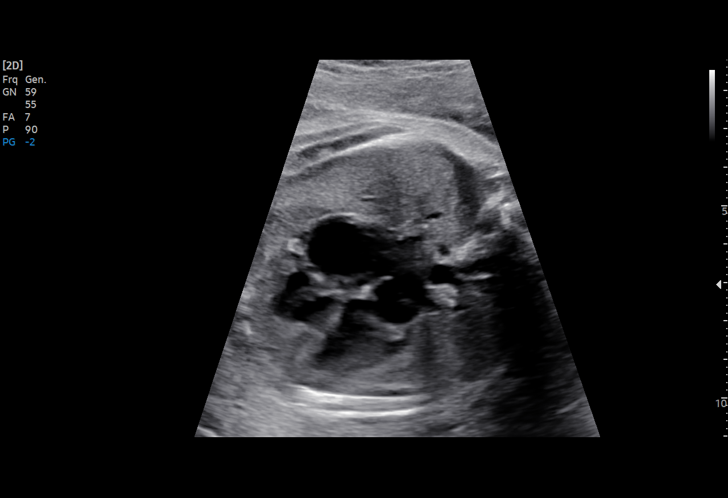
[im 32/32]
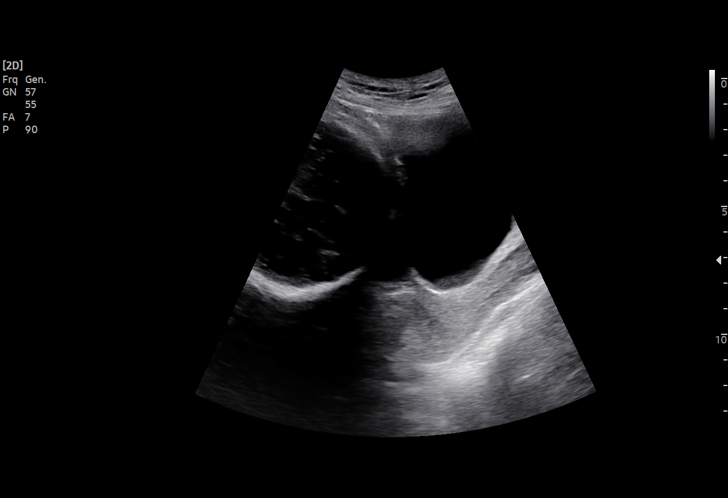

[15 of 28 positions shown; findings below may reference images not displayed]

FINDINGS: Number of Fetuses: 1

Heart Rate:  152 bpm

Movement: Present

Presentation: Cephalic

Previa: None

Placental Location: Anterior

Amniotic Fluid (Subjective): Normal

Amniotic Fluid (Objective):

Vertical pocket = 16.35cm

AFI = 16.4 cm (5%ile= 7.5 cm, 95%= 24.4 cm for 37 wks)

FETAL BIOMETRY

BPD: 8.99cm 36w 3d

HC:   32.96cm 37w 3d

AC:   32.2cm 36w 1d

FL:   7.18cm 36w 5d

Current Mean GA: 36w 5d US EDC: 08/14/2021

Assigned GA:  37w 3d Assigned EDC: 08/09/2021

Estimated Fetal Weight:  2944g 50.48%ile

percentile.

FETAL ANATOMY

Lateral Ventricles: Appears normal

Thalami/CSP: Appears normal

Posterior Fossa:  Previously seen

Nuchal Region: Not applicable NFT= not applicable

Upper Lip: Previously seen

Spine: Previously seen

4 Chamber Heart on Left: Appears normal

LVOT: Previously seen

RVOT: Previously seen

Stomach on Left: Appears normal

3 Vessel Cord: Appears normal

Cord Insertion site: Previously seen

Kidneys: Appears normal

Bladder: Appears normal

Extremities: Previously seen

Sex: Male

Technically difficult due to: Not applicable

Maternal Findings:

Cervix:  Not applicable
IMPRESSION: 1. Single living intrauterine fetus in cephalic presentation.
2. Size and dates correlate within 5 days.
3. No fetal anomalies identified.
4. Normal amniotic fluid volume.

## 2023-02-20 ENCOUNTER — Ambulatory Visit: Payer: BC Managed Care – PPO | Admitting: Family Medicine

## 2023-02-21 ENCOUNTER — Ambulatory Visit (INDEPENDENT_AMBULATORY_CARE_PROVIDER_SITE_OTHER): Payer: BC Managed Care – PPO | Admitting: Family Medicine

## 2023-02-21 ENCOUNTER — Encounter: Payer: Self-pay | Admitting: Family Medicine

## 2023-02-21 VITALS — BP 110/56 | HR 126 | Temp 98.1°F | Ht 61.0 in | Wt 109.5 lb

## 2023-02-21 DIAGNOSIS — L309 Dermatitis, unspecified: Secondary | ICD-10-CM | POA: Insufficient documentation

## 2023-02-21 DIAGNOSIS — W57XXXA Bitten or stung by nonvenomous insect and other nonvenomous arthropods, initial encounter: Secondary | ICD-10-CM | POA: Diagnosis not present

## 2023-02-21 DIAGNOSIS — S90561A Insect bite (nonvenomous), right ankle, initial encounter: Secondary | ICD-10-CM | POA: Diagnosis not present

## 2023-02-21 DIAGNOSIS — L2082 Flexural eczema: Secondary | ICD-10-CM

## 2023-02-21 MED ORDER — TRIAMCINOLONE ACETONIDE 0.1 % EX CREA: 1.0000 | TOPICAL_CREAM | Freq: Two times a day (BID) | CUTANEOUS | 0 refills | Status: AC

## 2023-02-21 NOTE — Patient Instructions (Signed)
Use insect repellent when you can   Clean any bites with soap and water  Cold compresses  Rubbing alcohol helps also   Try the triamcinolone cream as needed twice daily  Xyzal should help also

## 2023-02-21 NOTE — Assessment & Plan Note (Signed)
Worse in spirng allergy season  Refilled triamcinolone cream 0.1% to use bid (sparingly) to affected areas  Adv to avoid hot water /harsh detergents Moisturize frequently

## 2023-02-21 NOTE — Assessment & Plan Note (Signed)
This is resolving/ from this weekend Pt is very allergic to mosquito bites (also has eczema) Px triamcinolone cream 0.1% to use bid prn Inst to keep bite area clean (soap /water)  Rubbing alcohol may help soothe Cool compress Antihistamine (xyzal) prn   Update if not starting to improve in a week or if worsening   Enc to use inset repellent or clothing as well

## 2023-02-21 NOTE — Progress Notes (Signed)
Subjective:    Patient ID: Alexis Bolton Reason, female    DOB: 07/18/1996, 27 y.o.   MRN: [id number]  HPI Pt presents for c/o insect bite allergy  Eczema   Wt Readings from Last 3 Encounters:  02/21/23 109 lb 8 oz (49.7 kg)  01/25/23 109 lb 4 oz (49.6 kg)  01/04/23 113 lb (51.3 kg)   20.69 kg/m  Vitals:   02/21/23 1357  BP: (!) 110/56  Pulse: (!) 126  Temp: 98.1 F (36.7 C)  SpO2: 98%   Is allergic to mosquito and other bug bites   Gets swollen - very allergic  Used an old px cream   Pollen is bothering her a bit   Has xzal  Singulair   Eczema is chronic  Worse in spring/ allergy season  Small area on R arm today   Patient Active Problem List   Diagnosis Date Noted   Insect bite 02/21/2023   Eczema 02/21/2023   Iron deficiency 01/04/2023   Vitamin D deficiency 01/04/2023   Recurrent tonsillitis 10/05/2022   Diarrhea 05/09/2022   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   Contraception management 03/21/2022   Sore throat 02/22/2022   [redacted] weeks gestation of pregnancy 08/06/2021   Postpartum care following vaginal delivery 08/06/2021   Encounter for care or examination of lactating mother 08/06/2021   Normal labor and delivery 08/04/2021   Acute knee pain 04/12/2021   Supervision of normal first pregnancy 02/24/2021   Seasonal allergic rhinitis 07/31/2016   Past Medical History:  Diagnosis Date   Asthma    Eczema    Past Surgical History:  Procedure Laterality Date   NO PAST SURGERIES     Social History   Tobacco Use   Smoking status: Never    Passive exposure: Never   Smokeless tobacco: Never  Substance Use Topics   Alcohol use: No    Alcohol/week: 0.0 standard drinks of alcohol   Drug use: No   Family History  Family history unknown: Yes   Allergies  Allergen Reactions   Mosquito (Culex Pipiens) Allergy Skin Test Itching and Swelling   Other     Nuts, spring mix causes itchy throat   Penicillins    Pomegranate [[place]]     Itchy throat    Current Outpatient Medications on File Prior to Visit  Medication Sig Dispense Refill   acetaminophen (TYLENOL) 325 MG tablet Take 650 mg by mouth every 6 (six) hours as needed.     azelastine (ASTELIN) 0.1 % nasal spray as needed.     EPINEPHrine 0.3 mg/0.3 mL IJ SOAJ injection See admin instructions.     ergocalciferol (VITAMIN D2) 1.25 MG (50000 UT) capsule Take 1 capsule (50,000 Units total) by mouth once a week. 12 capsule 0   ferrous sulfate 325 (65 FE) MG EC tablet TAKE 1 TABLET BY MOUTH EVERY DAY WITH FOOD 90 tablet 0   fluticasone (FLONASE) 50 MCG/ACT nasal spray as needed.     levocetirizine (XYZAL) 5 MG tablet SMARTSIG:1 Tablet(s) By Mouth Every Evening     montelukast (SINGULAIR) 10 MG tablet Take 1 tablet by mouth at bedtime.     norethindrone-ethinyl estradiol-FE (JUNEL FE 1/20) 1-20 MG-MCG tablet Take 1 tablet by mouth daily. 28 tablet 11   sertraline (ZOLOFT) 50 MG tablet Take 1 tablet (50 mg total) by mouth daily. 90 tablet 3   No current facility-administered medications on file prior to visit.    Review of Systems  Constitutional:  Negative for activity change,  appetite change, fatigue, fever and unexpected weight change.  HENT:  Negative for congestion, ear pain, rhinorrhea, sinus pressure and sore throat.   Eyes:  Negative for pain, redness and visual disturbance.  Respiratory:  Negative for cough, shortness of breath and wheezing.   Cardiovascular:  Negative for chest pain and palpitations.  Gastrointestinal:  Negative for abdominal pain, blood in stool, constipation and diarrhea.  Endocrine: Negative for polydipsia and polyuria.  Genitourinary:  Negative for dysuria, frequency and urgency.  Musculoskeletal:  Negative for arthralgias, back pain and myalgias.  Skin:  Positive for rash. Negative for pallor.  Allergic/Immunologic: Negative for environmental allergies.  Neurological:  Negative for dizziness, syncope and headaches.  Hematological:  Negative for  adenopathy. Does not bruise/bleed easily.  Psychiatric/Behavioral:  Negative for decreased concentration and dysphoric mood. The patient is not nervous/anxious.        Objective:   Physical Exam Constitutional:      General: She is not in acute distress.    Appearance: Normal appearance. She is normal weight. She is not ill-appearing or diaphoretic.  HENT:     Head: Normocephalic and atraumatic.  Eyes:     General:        Right eye: No discharge.        Left eye: No discharge.     Conjunctiva/sclera: Conjunctivae normal.     Pupils: Pupils are equal, round, and reactive to light.  Cardiovascular:     Rate and Rhythm: Regular rhythm.  Pulmonary:     Effort: Pulmonary effort is normal. No respiratory distress.     Breath sounds: No wheezing.  Musculoskeletal:     Cervical back: Neck supple.  Lymphadenopathy:     Cervical: No cervical adenopathy.  Skin:    General: Skin is warm and dry.     Comments: 1 cm area of erythema and induration on R posterior ankle consistent with insect bite Small scab with excoriation  No drainage    1-2 cm oval area of pink scale on R antecubital area consistent with eczema   Neurological:     Mental Status: She is alert.     Sensory: No sensory deficit.           Assessment & Plan:   Problem List Items Addressed This Visit       Musculoskeletal and Integument   Eczema    Worse in spirng allergy season  Refilled triamcinolone cream 0.1% to use bid (sparingly) to affected areas  Adv to avoid hot water /harsh detergents Moisturize frequently         Other   Insect bite - Primary    This is resolving/ from this weekend Pt is very allergic to mosquito bites (also has eczema) Px triamcinolone cream 0.1% to use bid prn Inst to keep bite area clean (soap /water)  Rubbing alcohol may help soothe Cool compress Antihistamine (xyzal) prn   Update if not starting to improve in a week or if worsening   Enc to use inset repellent or  clothing as well

## 2023-03-09 ENCOUNTER — Encounter: Payer: Self-pay | Admitting: Family Medicine

## 2023-03-26 ENCOUNTER — Telehealth: Payer: Self-pay

## 2023-03-26 NOTE — Transitions of Care (Post Inpatient/ED Visit) (Signed)
   03/26/2023  Name: Alexis Bolton MRN: [id number] DOB: [birth year]  Today's TOC FU Call Status: Today's TOC FU Call Status:: Unsuccessul Call (1st Attempt) Unsuccessful Call (1st Attempt) Date: 03/26/23  Attempted to reach the patient regarding the most recent Inpatient/ED visit.  Follow Up Plan: Additional outreach attempts will be made to reach the patient to complete the Transitions of Care (Post Inpatient/ED visit) call.   Signature Elisha Ponder LPN [facility] AWV Team Direct dial:  [phone]

## 2023-03-27 NOTE — Transitions of Care (Post Inpatient/ED Visit) (Cosign Needed)
   03/27/2023  Name: NOLIE BIGNELL MRN: [id number] DOB: May 13, 1996  Today's TOC FU Call Status: Today's TOC FU Call Status:: Successful TOC FU Call Competed Unsuccessful Call (1st Attempt) Date: 03/26/23 [facility] FU Call Complete Date: 03/27/23  Transition Care Management Follow-up Telephone Call Date of Discharge: 03/25/23 Discharge Facility: Other ([facility]) Name of Other ([facility]) [facility]charge Facility: [facility] Type of Discharge: Emergency Department Reason for ED Visit: Other: (Inflammatory disease of cervix uter) How have you been since you were released from the hospital?: Better Any questions or concerns?: No  Items Reviewed: Did you receive and understand the discharge instructions provided?: Yes Medications obtained and verified?: Yes (Medications Reviewed) Any new allergies since your discharge?: No Dietary orders reviewed?: NA Do you have support at home?: Yes People in Home: significant other  Home Care and Equipment/Supplies: Were Home Health Services Ordered?: NA Any new equipment or medical supplies ordered?: NA  Functional Questionnaire: Do you need assistance with bathing/showering or dressing?: No Do you need assistance with meal preparation?: No Do you need assistance with eating?: No Do you have difficulty maintaining continence: No Do you need assistance with getting out of bed/getting out of a chair/moving?: No Do you have difficulty managing or taking your medications?: No  Follow up appointments reviewed: PCP Follow-up appointment confirmed?: NA Specialist Hospital Follow-up appointment confirmed?: Yes Date of Specialist follow-up appointment?: 04/04/23 Follow-Up Specialty Provider:: GYN Do you need transportation to your follow-up appointment?: No Do you understand care options if your condition(s) worsen?: Yes-patient verbalized understanding    SIGNATURE Donnamarie Poag, CMA

## 2023-03-27 NOTE — Addendum Note (Signed)
Addended by: Donnamarie Poag on: 03/27/2023 01:46 PM   Modules accepted: Orders

## 2023-03-29 ENCOUNTER — Encounter: Payer: Self-pay | Admitting: Family Medicine

## 2023-03-30 ENCOUNTER — Emergency Department: Payer: BC Managed Care – PPO

## 2023-03-30 ENCOUNTER — Emergency Department
Admission: EM | Admit: 2023-03-30 | Discharge: 2023-03-30 | Disposition: A | Discharge status: Home / Self Care | Payer: BC Managed Care – PPO | Attending: Emergency Medicine | Admitting: Emergency Medicine

## 2023-03-30 ENCOUNTER — Other Ambulatory Visit: Payer: Self-pay

## 2023-03-30 DIAGNOSIS — R102 Pelvic and perineal pain: Secondary | ICD-10-CM | POA: Insufficient documentation

## 2023-03-30 DIAGNOSIS — R42 Dizziness and giddiness: Secondary | ICD-10-CM | POA: Insufficient documentation

## 2023-03-30 DIAGNOSIS — R110 Nausea: Secondary | ICD-10-CM | POA: Insufficient documentation

## 2023-03-30 DIAGNOSIS — M545 Low back pain, unspecified: Secondary | ICD-10-CM | POA: Insufficient documentation

## 2023-03-30 LAB — URINALYSIS, ROUTINE W REFLEX MICROSCOPIC
Bilirubin Urine: NEGATIVE
Glucose, UA: NEGATIVE mg/dL
Ketones, ur: NEGATIVE mg/dL
Nitrite: NEGATIVE
Protein, ur: NEGATIVE mg/dL
Specific Gravity, Urine: 1.008 (ref 1.005–1.030)
pH: 6 (ref 5.0–8.0)

## 2023-03-30 LAB — COMPREHENSIVE METABOLIC PANEL
ALT: 13 U/L (ref 0–44)
AST: 18 U/L (ref 15–41)
Albumin: 4.5 g/dL (ref 3.5–5.0)
Alkaline Phosphatase: 41 U/L (ref 38–126)
Anion gap: 7 (ref 5–15)
BUN: 11 mg/dL (ref 6–20)
CO2: 27 mmol/L (ref 22–32)
Calcium: 9 mg/dL (ref 8.9–10.3)
Chloride: 103 mmol/L (ref 98–111)
Creatinine, Ser: 0.71 mg/dL (ref 0.44–1.00)
GFR, Estimated: >60 mL/min (ref >60)
Glucose, Bld: 126 mg/dL — ABNORMAL HIGH (ref 70–99)
Potassium: 3.6 mmol/L (ref 3.5–5.1)
Sodium: 137 mmol/L (ref 135–145)
Total Bilirubin: 0.8 mg/dL (ref 0.3–1.2)
Total Protein: 7.3 g/dL (ref 6.5–8.1)

## 2023-03-30 LAB — CBC
HCT: 38.3 % (ref 36.0–46.0)
Hemoglobin: 13.1 g/dL (ref 12.0–15.0)
MCH: 30.9 pg (ref 26.0–34.0)
MCHC: 34.2 g/dL (ref 30.0–36.0)
MCV: 90.3 fL (ref 80.0–100.0)
Platelets: 357 10*3/uL (ref 150–400)
RBC: 4.24 MIL/uL (ref 3.87–5.11)
RDW: 12.9 % (ref 11.5–15.5)
WBC: 4.8 10*3/uL (ref 4.0–10.5)
nRBC: 0 % (ref 0.0–0.2)

## 2023-03-30 LAB — POC URINE PREG, ED: Preg Test, Ur: NEGATIVE

## 2023-03-30 LAB — LIPASE, BLOOD: Lipase: 28 U/L (ref 11–51)

## 2023-03-30 MED ORDER — TRAMADOL HCL 50 MG PO TABS: 50.0000 mg | ORAL_TABLET | Freq: Four times a day (QID) | ORAL | 0 refills | Status: DC | PRN

## 2023-03-30 MED ORDER — KETOROLAC TROMETHAMINE 30 MG/ML IJ SOLN
30.0000 mg | Freq: Once | INTRAMUSCULAR | Status: AC
  Administered 2023-03-30: 30 mg via INTRAMUSCULAR
  Filled 2023-03-30: qty 1

## 2023-03-30 NOTE — ED Triage Notes (Signed)
Pt to ED via POV from home. Pt reports she was placed on a new medication and since has been feeling nauseas and dizzy. Pt was placed on metronidazole and doxycycline. Pt reports placed on medication due to infection on uterine lining and her IUD was in the wrong place. Pt reports lower abdominal pain and lower back pain.

## 2023-03-30 NOTE — ED Provider Notes (Signed)
[facility] Provider Note    Event Date/Time   First MD Initiated Contact with Patient 03/30/23 1058     (approximate)   History   Abdominal pain, nausea   HPI  Alexis Bolton is a 27 y.o. female who presents with complaints of abdominal pain, nausea.  Patient describes lower pelvic pain which is intermittent and crampy.  Patient reports she was seen at outside emergency department 3 days ago for similar pain which at that time was constant.  Had CT scan which demonstrated malpositioned IUD which was removed, she was placed on antibiotics, she reports improvement but intermittent continued crampy pain, bilaterally, she also complains of mild nausea which she thinks is related to the medication that she is taking.  No dysuria, no flank pain     Physical Exam   Triage Vital Signs: ED Triage Vitals  Enc Vitals Group     BP 03/30/23 0957 117/78     Pulse Rate 03/30/23 0957 95     Resp 03/30/23 0957 18     Temp 03/30/23 0957 98 F (36.7 C)     Temp Source 03/30/23 0957 Oral     SpO2 03/30/23 0957 98 %     Weight --      Height --      Head Circumference --      Peak Flow --      Pain Score 03/30/23 0958 7     Pain Loc --      Pain Edu? --      Excl. in GC? --     Most recent vital signs: Vitals:   03/30/23 0957 03/30/23 1234  BP: 117/78 121/72  Pulse: 95 88  Resp: 18 16  Temp: 98 F (36.7 C)   SpO2: 98% 98%     General: Awake, no distress.  CV:  Good peripheral perfusion.  Resp:  Normal effort.  Abd:  No distention.  Other:     ED Results / Procedures / Treatments   Labs (all labs ordered are listed, but only abnormal results are displayed) Labs Reviewed  COMPREHENSIVE METABOLIC PANEL - Abnormal; Notable for the following components:      Result Value   Glucose, Bld 126 (*)    All other components within normal limits  URINALYSIS, ROUTINE W REFLEX MICROSCOPIC - Abnormal; Notable for the following components:   Color, Urine  YELLOW (*)    APPearance HAZY (*)    Hgb urine dipstick MODERATE (*)    Leukocytes,Ua SMALL (*)    Bacteria, UA RARE (*)    All other components within normal limits  LIPASE, BLOOD  CBC  POC URINE PREG, ED     EKG     RADIOLOGY Ultrasound pelvis    PROCEDURES:  Critical Care performed:   Procedures   MEDICATIONS ORDERED IN ED: Medications  ketorolac (TORADOL) 30 MG/ML injection 30 mg (30 mg Intramuscular Given 03/30/23 1211)     IMPRESSION / MDM / ASSESSMENT AND PLAN / ED COURSE  I reviewed the triage vital signs and the nursing notes. Patient's presentation is most consistent with acute illness / injury with system symptoms.  Patient presents with pelvic discomfort bilaterally, recent pelvic exam, wet prep was unremarkable except some white blood cells, GC chlamydia was negative, no trichomonas, no BV per review of records from outside hospital  Will send for ultrasound given reassuring labs, evaluate for ovarian cysts, less likely torsion given bilateral nature.  Will treat with IM  Toradol for pain  Ultrasound is reassuring, patient feels better after IM Toradol, recommend discontinuing Flagyl, continuing doxycycline, outpatient follow-up recommended, return precautions discussed      FINAL CLINICAL IMPRESSION(S) / ED DIAGNOSES   Final diagnoses:  Pelvic pain     Rx / DC Orders   ED Discharge Orders          Ordered    traMADol (ULTRAM) 50 MG tablet  Every 6 hours PRN        03/30/23 1233             Note:  This document was prepared using Dragon voice recognition software and may include unintentional dictation errors.   Jene Every, MD 03/30/23 1240

## 2023-04-02 ENCOUNTER — Telehealth: Payer: Self-pay

## 2023-04-02 NOTE — Transitions of Care (Post Inpatient/ED Visit) (Signed)
   04/02/2023  Name: Alexis Bolton MRN: [id number] DOB: [birth year]  Today's TOC FU Call Status: Today's TOC FU Call Status:: Successful TOC FU Call Competed TOC FU Call Complete Date: 04/02/23  Transition Care Management Follow-up Telephone Call Date of Discharge: 03/30/23 Discharge Facility: [facility]) Type of Discharge: Emergency Department Reason for ED Visit: Other: (pelvic/ perianal pain) How have you been since you were released from the hospital?: Better Any questions or concerns?: No  Items Reviewed: Did you receive and understand the discharge instructions provided?: Yes Medications obtained and verified?: Yes (Medications Reviewed) Any new allergies since your discharge?: No Dietary orders reviewed?: NA Do you have support at home?: Yes  Home Care and Equipment/Supplies: Were Home Health Services Ordered?: NA Any new equipment or medical supplies ordered?: NA  Functional Questionnaire: Do you need assistance with bathing/showering or dressing?: No Do you need assistance with meal preparation?: No Do you need assistance with eating?: No Do you have difficulty maintaining continence: No Do you need assistance with getting out of bed/getting out of a chair/moving?: No Do you have difficulty managing or taking your medications?: No  Follow up appointments reviewed: PCP Follow-up appointment confirmed?: No Specialist Hospital Follow-up appointment confirmed?: Yes Date of Specialist follow-up appointment?: 04/04/23 Follow-Up Specialty Provider:: GYN Do you need transportation to your follow-up appointment?: No Do you understand care options if your condition(s) worsen?: Yes-patient verbalized understanding    SIGNATURE Elisha Ponder LPN [facility] AWV Team Direct dial:  [phone]

## 2023-04-04 ENCOUNTER — Ambulatory Visit (INDEPENDENT_AMBULATORY_CARE_PROVIDER_SITE_OTHER): Payer: BC Managed Care – PPO | Admitting: Obstetrics and Gynecology

## 2023-04-04 ENCOUNTER — Encounter: Payer: Self-pay | Admitting: Obstetrics and Gynecology

## 2023-04-04 VITALS — BP 106/70 | HR 82 | Wt 105.0 lb

## 2023-04-04 DIAGNOSIS — Z3009 Encounter for other general counseling and advice on contraception: Secondary | ICD-10-CM | POA: Diagnosis not present

## 2023-04-04 DIAGNOSIS — T8332XD Displacement of intrauterine contraceptive device, subsequent encounter: Secondary | ICD-10-CM

## 2023-04-04 NOTE — Progress Notes (Signed)
CC: Discuss ED visit for IUD removal and was advised that it punctured a hole in her cervix, wants to discuss getting a new IUD placed.   Had Alexis Bolton and would like that again if possible    Pt stating that she has not had sexual intercourse recently

## 2023-04-04 NOTE — Progress Notes (Signed)
Obstetrics and Gynecology New Patient Evaluation  Appointment Date: 04/04/2023  OBGYN Clinic: [facility]  Primary Care Provider: Roxy Manns A  Referring Provider: Self  Chief Complaint: Contraception management. Recent IUD malpositioning episode  History of Present Illness: Alexis Bolton is a 27 y.o. G1P1001 (Patient's last menstrual period was 03/19/2023.), seen for the above chief complaint. Her past medical history is significant for nothing  Patient had Mirena IUD placed at [facility] in late November 2023. Patient had intercourse with her partner and immediately noted cramping so went to the ED on 4/21. IUD noted on speculum exam with CT scan showing that it was in the cervical canal. STI swabs negative. Mirena was removed w/o difficulty.   She went back to the ED on 4/26 for continued cramping and evaluation was negative u/s read (see below).   She states she called [facility] but couldn't get in touch with them for follow up so made an appointment with [place].   No bleeding or cramping currently.   Review of Systems: Pertinent items noted in HPI and remainder of comprehensive ROS otherwise negative.    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 Periods: qmonth, regular, heavy and painful w/o the Mirena.  History of Pap Smear(s): Yes.   Last pap 2022, which was negative  Social History:  Social History   Socioeconomic History   Marital status: Single    Spouse name: Not on file   Number of children: Not on file   Years of education: Not on file   Highest education level: Associate degree: academic program  Occupational History   Not on file  Tobacco Use   Smoking status: Never    Passive exposure: Never   Smokeless tobacco: Never  Vaping Use   Vaping Use: Not on file  Substance and Sexual Activity   Alcohol use: No    Alcohol/week: 0.0 standard drinks of alcohol   Drug use: No   Sexual activity: Yes  Other Topics Concern   Not on file  Social History Narrative   Lives with mom, dad and sister   Social Determinants of Health   Financial Resource Strain: Low Risk  (02/20/2023)   Overall Financial Resource Strain (CARDIA)    Difficulty of Paying Living Expenses: Not hard at all  Food Insecurity: No Food Insecurity (02/20/2023)   Hunger Vital Sign    Worried About Running Out of Food in the Last Year: Never true    Ran Out of Food in the Last Year: Never true  Transportation Needs:  No Transportation Needs (02/20/2023)   PRAPARE - Administrator, Civil Service (Medical): No    Lack of Transportation (Non-Medical): No  Physical Activity: Insufficiently Active (02/20/2023)   Exercise Vital Sign    Days of Exercise per Week: 7 days    Minutes of Exercise per Session: 10 min  Stress: No Stress Concern Present (02/20/2023)   [employer] of Occupational Health - Occupational Stress Questionnaire    Feeling of Stress : Not at all  Social Connections: Socially Isolated (02/20/2023)   Social Connection and Isolation Panel [NHANES]    Frequency of Communication with Friends and Family: More than three times a week    Frequency of Social Gatherings with Friends and Family:  More than three times a week    Attends Religious Services: Never    Database administrator or Organizations: No    Attends Engineer, structural: Not on file    Marital Status: Never married  Intimate Partner Violence: Not on file    Family History:  Family History  Family history unknown: Yes   Medications Alexis Bolton had no medications administered during this visit. Current Outpatient Medications  Medication Sig Dispense Refill   acetaminophen (TYLENOL) 325 MG tablet Take 650 mg by mouth every 6 (six) hours as needed.     azelastine (ASTELIN) 0.1 % nasal spray as needed.     doxycycline (VIBRAMYCIN) 100 MG capsule Take by mouth.     EPINEPHrine 0.3 mg/0.3 mL IJ SOAJ injection See admin instructions. (Patient not taking: Reported on 04/04/2023)     ergocalciferol (VITAMIN D2) 1.25 MG (50000 UT) capsule Take 1 capsule (50,000 Units total) by mouth once a week. 12 capsule 0   ferrous sulfate 325 (65 FE) MG EC tablet TAKE 1 TABLET BY MOUTH EVERY DAY WITH FOOD 90 tablet 0   fluticasone (FLONASE) 50 MCG/ACT nasal spray as needed.     levocetirizine (XYZAL) 5 MG tablet SMARTSIG:1 Tablet(s) By Mouth Every Evening     metroNIDAZOLE (FLAGYL) 500 MG tablet Take by mouth. (Patient not taking: Reported on 04/04/2023)     montelukast (SINGULAIR) 10 MG tablet Take 1 tablet by mouth at bedtime.     sertraline (ZOLOFT) 50 MG tablet Take 1 tablet (50 mg total) by mouth daily. 90 tablet 3   traMADol (ULTRAM) 50 MG tablet Take 1 tablet (50 mg total) by mouth every 6 (six) hours as needed. (Patient not taking: Reported on 04/04/2023) 20 tablet 0   triamcinolone cream (KENALOG) 0.1 % Apply 1 Application topically 2 (two) times daily. Apply to affected area as needed for allergic reaction /eczema 30 g 0   No current facility-administered medications for this visit.    Allergies Mosquito (culex pipiens) allergy skin test, Other, Penicillins, and Pomegranate [punica]   Physical Exam:  BP  106/70   Pulse 82   Wt 105 lb (47.6 kg)   LMP 03/19/2023   BMI 19.84 kg/m  Body mass index is 19.84 kg/m. General appearance: Well nourished, well developed female in no acute distress.  Neck:  Supple, normal appearance, and no thyromegaly  Respiratory:  Normal respiratory effort Abdomen: no masses, hernias; diffusely non tender to palpation, non distended Neuro/Psych:  Normal mood and affect.  Skin:  Warm and dry.   Laboratory: as per HPI  Radiology: images reviewed and uterus is very anteverted and anteflexed Narrative & Impression  CLINICAL DATA:  Pelvic pain since IUD removal   EXAM: TRANSABDOMINAL ULTRASOUND OF PELVIS  TECHNIQUE: Transabdominal ultrasound examinations of the pelvis were performed. Transabdominal technique was performed for global imaging of the pelvis including uterus, ovaries, adnexal regions, and pelvic cul-de-sac.   COMPARISON:  None Available.   FINDINGS: Uterus   Measurements: 7.1 x 4.2 x 6.1 cm = volume: 96.3 mL. No fibroids or other mass visualized.   Endometrium   Thickness: 6 mm.  No focal abnormality visualized.   Right ovary   Measurements: 2.8 x 1.6 x 2.1 cm = volume: 5.2 mL. Normal appearance/no adnexal mass.   Left ovary   Measurements: 2.8 x 2.2 by 2.9 cm = volume: 9.3 mL. Small follicles are seen. There is also a 2.3 x 1.7 x 1.7 cm left-sided simple appearing ovarian cyst. No specific imaging follow-up based on appearance and age.   Other findings   No abnormal free fluid.   IMPRESSION: Unremarkable pelvic ultrasound.  No free fluid.     Electronically Signed   By: Karen Kays M.D.   On: 03/30/2023 12:17    Assessment:   Plan:  1. Encounter for other general counseling or advice on contraception Options d/w her and she desires a LARC method and one that can help with her painful and heavy periods, and she would like to do the Mirena again because it helped with her periods. I told her that her uterus is very  flexed and tilted forward and it may have not been initially placed in the fundal location. I told her that if she desires another Mirena I would recommend doing it under ultrasound guidance, which she is amenable to. She can't do it today and next availability is on 5/8 or 5/9. Recommend placement with MD and will try and get her scheduled for next week.  No sex since IUD came out and pt okay with abstinence until placement  2. Malpositioned intrauterine device (IUD), subsequent encounter   Return in 1 week (on 04/11/2023). For u/s guided Mirena IUD placement  Future Appointments  Date Time Provider Department Center  04/11/2023  2:30 PM Reva Bores, MD [facility]    Cornelia Copa MD Attending [facility])

## 2023-04-11 ENCOUNTER — Encounter: Payer: Self-pay | Admitting: Family Medicine

## 2023-04-11 ENCOUNTER — Ambulatory Visit (INDEPENDENT_AMBULATORY_CARE_PROVIDER_SITE_OTHER): Payer: BC Managed Care – PPO | Admitting: Family Medicine

## 2023-04-11 VITALS — BP 106/73 | HR 98 | Wt 102.0 lb

## 2023-04-11 DIAGNOSIS — Z30430 Encounter for insertion of intrauterine contraceptive device: Secondary | ICD-10-CM | POA: Diagnosis not present

## 2023-04-11 DIAGNOSIS — Z975 Presence of (intrauterine) contraceptive device: Secondary | ICD-10-CM | POA: Insufficient documentation

## 2023-04-11 MED ORDER — LEVONORGESTREL 20 MCG/DAY IU IUD
1.0000 | INTRAUTERINE_SYSTEM | Freq: Once | INTRAUTERINE | Status: AC
  Administered 2023-04-11: 1 via INTRAUTERINE

## 2023-04-11 NOTE — Progress Notes (Signed)
    GYNECOLOGY OFFICE PROCEDURE NOTE  Alexis Bolton is a 27 y.o. G1P1001 here for Mirena IUD insertion. No GYN concerns.  Last pap smear was on 09/15/2021 and was normal. Previous one dislodged to cervix, so here for u/s guidance for placement  IUD Insertion Procedure Note Patient identified, informed consent performed, consent signed.  Discussed risks of irregular bleeding, cramping, infection, malpositioning or misplacement of the IUD outside the uterus which may require further procedure such as laparoscopy. Also discussed >99% contraception efficacy, increased risk of ectopic pregnancy with failure of method. Time out was performed.  Urine pregnancy test negative.  Speculum placed in the vagina.  Cervix visualized.  Cleaned with Betadine x 2.  Grasped anteriorly with a single tooth tenaculum.  Uterus sounded to 9 cm.  Mirena IUD placed per manufacturer's recommendations with ultrasound guidance to confirm, inserted to fundus.   Strings trimmed to 3 cm. Tenaculum was removed, good hemostasis noted.  Patient tolerated procedure well.   Patient was given post-procedure instructions.  She was advised to have backup contraception for one week.  Patient was also asked to check IUD strings periodically and follow up in 4 weeks for IUD check.  Reva Bores, MD 04/11/2023 4:49 PM

## 2023-04-11 NOTE — Progress Notes (Signed)
CC: IUD placement under ultrasound per Dr.Pickens

## 2023-04-12 ENCOUNTER — Other Ambulatory Visit: Payer: Self-pay | Admitting: Family Medicine

## 2023-04-12 ENCOUNTER — Ambulatory Visit: Payer: BC Managed Care – PPO | Admitting: Advanced Practice Midwife

## 2023-05-09 ENCOUNTER — Ambulatory Visit: Payer: BC Managed Care – PPO | Admitting: Family Medicine

## 2023-05-14 ENCOUNTER — Ambulatory Visit (INDEPENDENT_AMBULATORY_CARE_PROVIDER_SITE_OTHER): Payer: BC Managed Care – PPO | Admitting: Obstetrics and Gynecology

## 2023-05-14 VITALS — BP 111/72 | HR 120 | Wt 99.0 lb

## 2023-05-14 DIAGNOSIS — R110 Nausea: Secondary | ICD-10-CM | POA: Diagnosis not present

## 2023-05-14 DIAGNOSIS — N6452 Nipple discharge: Secondary | ICD-10-CM | POA: Diagnosis not present

## 2023-05-14 DIAGNOSIS — Z30431 Encounter for routine checking of intrauterine contraceptive device: Secondary | ICD-10-CM | POA: Diagnosis not present

## 2023-05-14 NOTE — Progress Notes (Signed)
RGYN patient presents for IUD string check.  Mirena Insertion 04/27/23.  Pt has some concerns and questions reports nausea, and breast discomfort also leaking from breast that is white..   Notes spotting and clots.   At home UPT have been negatives pt concerned if she is pregnant w/IUD.

## 2023-05-14 NOTE — Progress Notes (Signed)
Obstetrics and Gynecology Visit Return Patient Evaluation  Appointment Date: 05/14/2023  Primary Care Provider: Judy Bolton  OBGYN Clinic: [facility] Complaint: IUD check up. Nausea. Nipple discharge.   History of Present Illness:  Alexis Bolton is a 27 y.o. s/p 5/8 ultrasound guided mirena IUD insertion due to h/o IUD coming out. Patient had sex a week ago and no problems or issues  Patient states that ever since she stopped breastfeeding about 18 months ago, after breastfeeding for about three months, and she has noticed b/l nipple discharge about every 3 months. She states it is always clear or white, from both sides, can come from different points on the nipple, no lumps or masses and she doesn't manipulate nipples/breasts  ?period starting in the past few days.   Review of Systems: as noted in the History of Present Illness.   Patient Active Problem List   Diagnosis Date Noted   Eczema 02/21/2023   Vitamin D deficiency 01/04/2023   Recurrent tonsillitis 10/05/2022   Acute knee pain 04/12/2021   Seasonal allergic rhinitis 07/31/2016   Medications:  Alexis Bolton had no medications administered during this visit. Current Outpatient Medications  Medication Sig Dispense Refill   azelastine (ASTELIN) 0.1 % nasal spray as needed.     ergocalciferol (VITAMIN D2) 1.25 MG (50000 UT) capsule Take 1 capsule (50,000 Units total) by mouth once a week. 12 capsule 0   ferrous sulfate 325 (65 FE) MG EC tablet TAKE 1 TABLET BY MOUTH EVERY DAY WITH FOOD 90 tablet 0   fluticasone (FLONASE) 50 MCG/ACT nasal spray as needed.     levocetirizine (XYZAL) 5 MG tablet SMARTSIG:1 Tablet(s) By Mouth Every Evening     levonorgestrel (MIRENA) 20 MCG/DAY IUD 1 each by Intrauterine route once.     montelukast (SINGULAIR) 10 MG tablet Take 1 tablet by mouth at bedtime.     sertraline (ZOLOFT) 50 MG tablet TAKE 1 TABLET BY MOUTH EVERY DAY 90 tablet 0    triamcinolone cream (KENALOG) 0.1 % Apply 1 Application topically 2 (two) times daily. Apply to affected area as needed for allergic reaction /eczema 30 g 0   acetaminophen (TYLENOL) 325 MG tablet Take 650 mg by mouth every 6 (six) hours as needed. (Patient not taking: Reported on 04/11/2023)     EPINEPHrine 0.3 mg/0.3 mL IJ SOAJ injection See admin instructions. (Patient not taking: Reported on 04/04/2023)     No current facility-administered medications for this visit.    Allergies: is allergic to mosquito (culex pipiens) allergy skin test, other, penicillins, and pomegranate [punica].  Physical Exam:  BP 111/72   Pulse (!) 120   Wt 99 lb (44.9 kg)   BMI 18.71 kg/m  Body mass index is 18.71 kg/m. General appearance: Well nourished, well developed female in no acute distress.  Breasts: I was able to express a small pinpoint white-clear spot from one duct on the left nipple. Otherwise no other findings from either breast Abdomen: diffusely non tender to palpation, non distended, and no masses, hernias Neuro/Psych:  Normal mood and affect.    Pelvic exam:  Cervical exam performed in the presence of a chaperone EGBUS: normal Vaginal vault: scant old blood in the vault Cervix:  wnl. IUD strings 3-4cm.  Bimanual: deferred  Assessment: patient stable  Plan:  1. Nipple discharge Likely benign, physicologic. F/u labs  - TSH + free T4 - Prolactin - Beta hCG quant (ref lab)  2. IUD check up  wnl  3. Nausea Pt concerned may be pregnant. F/u labs   RTC: PRN   Cornelia Copa MD Attending Center for [facility])

## 2023-05-15 LAB — TSH+FREE T4
Free T4: 1.38 ng/dL (ref 0.82–1.77)
TSH: 0.706 u[IU]/mL (ref 0.450–4.500)

## 2023-05-15 LAB — PROLACTIN: Prolactin: 14.4 ng/mL (ref 4.8–33.4)

## 2023-05-15 LAB — BETA HCG QUANT (REF LAB): hCG Quant: <1 m[IU]/mL

## 2023-06-11 ENCOUNTER — Ambulatory Visit: Payer: BC Managed Care – PPO | Admitting: Obstetrics and Gynecology

## 2023-06-26 ENCOUNTER — Ambulatory Visit
Admission: RE | Admit: 2023-06-26 | Discharge: 2023-06-26 | Disposition: A | Discharge status: Home / Self Care | Payer: BC Managed Care – PPO | Source: Ambulatory Visit | Attending: Emergency Medicine | Admitting: Emergency Medicine

## 2023-06-26 VITALS — BP 111/74 | HR 90 | Temp 98.0°F | Resp 18

## 2023-06-26 DIAGNOSIS — L0231 Cutaneous abscess of buttock: Secondary | ICD-10-CM

## 2023-06-26 MED ORDER — DOXYCYCLINE HYCLATE 100 MG PO CAPS: 100.0000 mg | ORAL_CAPSULE | Freq: Two times a day (BID) | ORAL | 0 refills | Status: AC

## 2023-06-26 NOTE — ED Triage Notes (Signed)
Patient to Urgent Care with complaints of abscess present to her right glute. Reports that it started draining on Friday night- has continuously drained. Concerned because drainage has turned green.

## 2023-06-26 NOTE — Discharge Instructions (Addendum)
Take the doxycycline as directed.  Follow up with your primary care provider.

## 2023-06-26 NOTE — ED Provider Notes (Signed)
[facility]    CSN: [id number] Arrival date & time: 06/26/23  1310      History   Chief Complaint Chief Complaint  Patient presents with   Abscess    I believe it maybe an abscess on my butt cheek - Entered by patient    HPI Alexis Bolton is a 27 y.o. female.  Patient presents with 4-day history of an abscess on her right lower buttock.  It has been draining and the drainage has become green.  No fever, chills, numbness, weakness, or other symptoms.  She has been treating it with topical antibiotic.  Last tetanus 2022.  The history is provided by the patient and medical records.    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 Authorizing Provider  doxycycline (VIBRAMYCIN) 100 MG capsule Take 1 capsule (100 mg total) by mouth 2 (two) times daily for 7 days. 06/26/23 07/03/23 Yes Mickie Bail, NP  acetaminophen (TYLENOL) 325 MG tablet Take 650 mg by mouth every 6 (six) hours as needed. Patient not taking: Reported on 04/11/2023    [provider]  azelastine (ASTELIN) 0.1 % nasal spray as needed. 04/01/21   [provider]  EPINEPHrine 0.3 mg/0.3 mL IJ SOAJ injection See admin instructions. Patient not taking: Reported on 04/04/2023    [provider]  ergocalciferol (VITAMIN D2) 1.25 MG (50000 UT) capsule Take 1 capsule  (50,000 Units total) by mouth once a week. 01/04/23   Tower, Audrie Gallus, MD  ferrous sulfate 325 (65 FE) MG EC tablet TAKE 1 TABLET BY MOUTH EVERY DAY WITH FOOD 01/29/23   Tower, Audrie Gallus, MD  fluticasone (FLONASE) 50 MCG/ACT nasal spray as needed.    [provider]  levocetirizine (XYZAL) 5 MG tablet SMARTSIG:1 Tablet(s) By Mouth Every Evening 04/01/21   [provider]  levonorgestrel (MIRENA) 20 MCG/DAY IUD 1 each by Intrauterine route once. 04/04/23   [provider]  montelukast (SINGULAIR) 10 MG tablet Take 1 tablet by mouth at bedtime. 04/01/21   [provider]  sertraline (ZOLOFT) 50 MG tablet TAKE 1 TABLET BY MOUTH EVERY DAY 04/12/23   Tower, Audrie Gallus, MD  triamcinolone cream (KENALOG) 0.1 % Apply 1 Application topically 2 (two) times daily. Apply to affected area as needed for allergic reaction Chesley Noon 02/21/23 02/21/24  Tower, Audrie Gallus, MD    Family History Family History  Family history unknown: Yes    Social History Social History   Tobacco Use   Smoking status: Never  Passive exposure: Never   Smokeless tobacco: Never  Substance Use Topics   Alcohol use: No    Alcohol/week: 0.0 standard drinks of alcohol   Drug use: No     Allergies   Mosquito (culex pipiens) allergy skin test, Other, Penicillins, and Pomegranate [punica]   Review of Systems Review of Systems  Constitutional:  Negative for chills.  Musculoskeletal:  Negative for arthralgias and gait problem.  Skin:  Positive for color change and wound.  Neurological:  Negative for weakness and numbness.     Physical Exam Triage Vital Signs ED Triage Vitals  Encounter Vitals Group     BP      Systolic BP Percentile      Diastolic BP Percentile      Pulse      Resp      Temp      Temp src      SpO2      Weight      Height      Head Circumference      Peak Flow      Pain Score      Pain Loc      Pain Education      Exclude from Growth Chart    No data found.  Updated  Vital Signs BP 111/74   Pulse 90   Temp 98 F (36.7 C)   Resp 18   SpO2 98%   Visual Acuity Right Eye Distance:   Left Eye Distance:   Bilateral Distance:    Right Eye Near:   Left Eye Near:    Bilateral Near:     Physical Exam Constitutional:      General: She is not in acute distress. HENT:     Mouth/Throat:     Mouth: Mucous membranes are moist.  Cardiovascular:     Rate and Rhythm: Normal rate and regular rhythm.     Heart sounds: Normal heart sounds.  Pulmonary:     Effort: Pulmonary effort is normal. No respiratory distress.     Breath sounds: Normal breath sounds.  Musculoskeletal:        General: No swelling or deformity. Normal range of motion.  Skin:    General: Skin is warm and dry.     Findings: Erythema and lesion present.     Comments: 5 cm circular area of induration with central open superficial lesion; no drainage.  Neurological:     General: No focal deficit present.     Mental Status: She is alert and oriented to person, place, and time.     Sensory: No sensory deficit.     Motor: No weakness.     Gait: Gait normal.  Psychiatric:        Mood and Affect: Mood normal.        Behavior: Behavior normal.      UC Treatments / Results  Labs (all labs ordered are listed, but only abnormal results are displayed) Labs Reviewed - No data to display  EKG   Radiology No results found.  Procedures Incision and Drainage  Date/Time: 06/26/2023 1:53 PM  Performed by: Mickie Bail, NP Authorized by: Mickie Bail, NP   Consent:    Consent obtained:  Verbal   Consent given by:  Patient   Risks discussed:  Bleeding, incomplete drainage, infection and pain Universal protocol:    Procedure explained and questions answered to patient or proxy's satisfaction: yes   Location:    Type:  Abscess  Location:  Anogenital   Anogenital location: right lower buttock. Pre-procedure details:    Skin preparation:  Povidone-iodine Anesthesia:     Anesthesia method:  Local infiltration   Local anesthetic:  Lidocaine 1% w/o epi Procedure type:    Complexity:  Simple Procedure details:    Incision types:  Stab incision   Drainage:  Bloody   Drainage amount:  Scant   Wound treatment:  Wound left open   Packing materials:  None Post-procedure details:    Procedure completion:  Tolerated well, no immediate complications  (including critical care time)  Medications Ordered in UC Medications - No data to display  Initial Impression / Assessment and Plan / UC Course  I have reviewed the triage vital signs and the nursing notes.  Pertinent labs & imaging results that were available during my care of the patient were reviewed by me and considered in my medical decision making (see chart for details).    Right buttock abscess.  I&D performed with return of scant bloody drainage only.  Treating with doxycycline.  Wound care instructions and signs of worsening infection discussed with patient.  Education provided on skin abscess.  Instructed her to follow-up with her PCP.  She agrees to plan of care.  Final Clinical Impressions(s) / UC Diagnoses   Final diagnoses:  Abscess of buttock, right     Discharge Instructions      Take the doxycycline as directed.    Follow up with your primary care provider.        ED Prescriptions     Medication Sig Dispense Auth. Provider   doxycycline (VIBRAMYCIN) 100 MG capsule Take 1 capsule (100 mg total) by mouth 2 (two) times daily for 7 days. 14 capsule Mickie Bail, NP      PDMP not reviewed this encounter.   Mickie Bail, NP 06/26/23 1355

## 2023-06-28 ENCOUNTER — Ambulatory Visit (INDEPENDENT_AMBULATORY_CARE_PROVIDER_SITE_OTHER): Payer: BC Managed Care – PPO | Admitting: Obstetrics & Gynecology

## 2023-06-28 ENCOUNTER — Encounter: Payer: Self-pay | Admitting: Obstetrics & Gynecology

## 2023-06-28 VITALS — BP 119/82 | HR 83

## 2023-06-28 DIAGNOSIS — Z975 Presence of (intrauterine) contraceptive device: Secondary | ICD-10-CM

## 2023-06-28 DIAGNOSIS — Z30431 Encounter for routine checking of intrauterine contraceptive device: Secondary | ICD-10-CM

## 2023-06-28 NOTE — Progress Notes (Signed)
    GYNECOLOGY OFFICE ENCOUNTER NOTE  History:  27 y.o. G1P1001 here today for today for IUD string check due to inability to feel strings herself at home; Mirena  IUD was placed 04/11/23. No complaints about the IUD, no concerning side effects.  The following portions of the patient's history were reviewed and updated as appropriate: allergies, current medications, past family history, past medical history, past social history, past surgical history and problem list. Last pap smear on 09/15/2021 was normal.  Review of Systems:  Pertinent items are noted in HPI.   Objective:  Physical Exam Blood pressure 119/82, pulse 83, not currently breastfeeding. CONSTITUTIONAL: Well-developed, well-nourished female in no acute distress.  NEUROLOGIC: Alert and oriented to person, place, and time. Normal reflexes, muscle tone coordination.  PSYCHIATRIC: Normal mood and affect. Normal behavior. Normal judgment and thought content. CARDIOVASCULAR: Normal heart rate noted RESPIRATORY: Effort and breath sounds normal, no problems with respiration noted ABDOMEN: Soft, no distention noted.   PELVIC: Normal appearing external genitalia; normal appearing vaginal mucosa and cervix.  Mirena grey IUD strings visualized, about 3 cm in length outside cervix. Done in the presence of a chaperone.   Assessment & Plan:  Patient is reassured that her IUD check is normal. She has no other concerns. She can keep IUD in place for up to eight years; can come in for removal earlier if she desires or for any concerning side effects. Recommended condoms for STI prevention.  Routine preventative health maintenance measures emphasized.   Jaynie Collins, MD, FACOG Obstetrician & Gynecologist, [facility] for [employer], [facility]

## 2023-07-07 ENCOUNTER — Other Ambulatory Visit: Payer: Self-pay | Admitting: Family Medicine

## 2023-09-05 ENCOUNTER — Encounter: Payer: Self-pay | Admitting: Family Medicine

## 2023-09-05 ENCOUNTER — Ambulatory Visit: Payer: BC Managed Care – PPO | Admitting: Family Medicine

## 2023-09-05 VITALS — BP 116/64 | HR 94 | Temp 98.0°F | Ht 61.0 in | Wt 99.0 lb

## 2023-09-05 DIAGNOSIS — B079 Viral wart, unspecified: Secondary | ICD-10-CM | POA: Diagnosis not present

## 2023-09-05 NOTE — Progress Notes (Signed)
Subjective:    Patient ID: Alexis Bolton, female    DOB: May 30, 1996, 27 y.o.   MRN: [id number]  HPI  Wt Readings from Last 3 Encounters:  09/05/23 99 lb (44.9 kg)  05/14/23 99 lb (44.9 kg)  04/11/23 102 lb (46.3 kg)   18.71 kg/m  Vitals:   09/05/23 0850  BP: 116/64  Pulse: 94  Temp: 98 F (36.7 C)  SpO2: 98%    Pt presents with c/o bump on her finger  Right 3rd finger   Flaking  Hurts when things hit it or touch it  Getting bigger Has had for several years but more bothersome now  Thinks it is a wart   Has not tried over the counter treatment  Patient Active Problem List   Diagnosis Date Noted   Viral wart on finger 09/05/2023   Mirena IUD (intrauterine device) in place since 04/11/23 04/11/2023   Eczema 02/21/2023   Vitamin D deficiency 01/04/2023   Recurrent tonsillitis 10/05/2022   Acute knee pain 04/12/2021   Seasonal allergic rhinitis 07/31/2016   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   Past Surgical History:  Procedure Laterality Date   NO PAST SURGERIES     Social History   Tobacco Use   Smoking status: Never    Passive exposure: Never   Smokeless tobacco: Never  Substance Use Topics   Alcohol use: No    Alcohol/week: 0.0 standard drinks of alcohol   Drug use: No   Family History  Family history unknown: Yes   Allergies  Allergen Reactions   Mosquito (Culex Pipiens) Allergy Skin Test Itching and Swelling   Other     Nuts, spring mix causes itchy throat   Penicillins    Pomegranate [Punica]     Itchy throat   Current Outpatient Medications on File Prior to Visit  Medication Sig Dispense Refill   acetaminophen (TYLENOL) 325 MG tablet Take 650 mg by mouth every 6 (six) hours as needed.     azelastine (ASTELIN) 0.1 % nasal spray as needed.     ergocalciferol (VITAMIN D2) 1.25 MG (50000 UT) capsule Take 1 capsule (50,000 Units total) by mouth once a week. 12 capsule 0   ferrous  sulfate 325 (65 FE) MG EC tablet TAKE 1 TABLET BY MOUTH EVERY DAY WITH FOOD 90 tablet 0   fluticasone (FLONASE) 50 MCG/ACT nasal spray as needed.     levocetirizine (XYZAL) 5 MG tablet SMARTSIG:1 Tablet(s) By Mouth Every Evening     levonorgestrel (MIRENA) 20 MCG/DAY IUD 1 each by Intrauterine route once.     montelukast (SINGULAIR) 10 MG tablet Take 1 tablet by mouth at bedtime.     sertraline (ZOLOFT) 50 MG tablet TAKE 1 TABLET BY MOUTH EVERY DAY 90 tablet 0   triamcinolone cream (KENALOG) 0.1 % Apply 1 Application topically 2 (two) times daily. Apply to affected area as needed for allergic reaction /eczema 30 g 0   No current facility-administered medications on file prior to visit.    Review of Systems  Constitutional:  Negative for activity change, appetite change, fatigue, fever and unexpected weight change.  HENT:  Negative for congestion, ear pain, rhinorrhea, sinus pressure and sore throat.   Eyes:  Negative for pain, redness and visual disturbance.  Respiratory:  Negative for cough, shortness of breath and wheezing.   Cardiovascular:  Negative for chest pain and palpitations.  Gastrointestinal:  Negative  for abdominal pain, blood in stool, constipation and diarrhea.  Endocrine: Negative for polydipsia and polyuria.  Genitourinary:  Negative for dysuria, frequency and urgency.  Musculoskeletal:  Negative for arthralgias, back pain and myalgias.  Skin:  Negative for pallor and rash.       Wart? On finger   Allergic/Immunologic: Negative for environmental allergies.  Neurological:  Negative for dizziness, syncope and headaches.  Hematological:  Negative for adenopathy. Does not bruise/bleed easily.  Psychiatric/Behavioral:  Negative for decreased concentration and dysphoric mood. The patient is not nervous/anxious.        Objective:   Physical Exam Constitutional:      General: She is not in acute distress.    Appearance: Normal appearance. She is normal weight. She is not  ill-appearing.  Eyes:     Conjunctiva/sclera: Conjunctivae normal.     Pupils: Pupils are equal, round, and reactive to light.  Cardiovascular:     Rate and Rhythm: Normal rate and regular rhythm.     Heart sounds: Normal heart sounds.  Pulmonary:     Effort: Pulmonary effort is normal.     Breath sounds: Normal breath sounds.  Musculoskeletal:        General: No tenderness.  Skin:    Findings: Lesion present. No bruising or erythema.     Comments: Simple wart on right 3rd distal/medial finger, with some scale and area of abrasion but not infected appearing  After consent was obtained Area cleaned and debrided with #11 scalpel Then treated with liquid nitrogen (times 3) full freeze and thaw  Pt tolerated well  Dressed with antibiotic ointment and band aid      Neurological:     Mental Status: She is alert.  Psychiatric:        Mood and Affect: Mood normal.           Assessment & Plan:   Problem List Items Addressed This Visit       Other   Viral wart on finger - Primary    Right 3rd finger/ distal and medial  Simple wart  3-4 mm diameter  Treated with debridement/ cryo and tolerated well Discussed after care  Update if not starting to improve in a week or if worsening  Call back and Er precautions noted in detail today    Will follow up in about a month if not improved

## 2023-09-05 NOTE — Assessment & Plan Note (Signed)
Right 3rd finger/ distal and medial  Simple wart  3-4 mm diameter  Treated with debridement/ cryo and tolerated well Discussed after care  Update if not starting to improve in a week or if worsening  Call back and Er precautions noted in detail today    Will follow up in about a month if not improved

## 2023-09-05 NOTE — Patient Instructions (Signed)
We treated your wart today It will get red/ blistered/ sore  Keep clean with soap and water Antibiotic ointment is ok  Try not to submerge it  Lightly cover until blister is healed  Coban with soft gauze underneath is ok   Once healed you can use a clean pumice stone to get dead skin off and /or compound W   Follow in a month if wart is not gone   If any concerns or signs of infection -call us

## 2023-10-07 ENCOUNTER — Other Ambulatory Visit: Payer: Self-pay | Admitting: Family Medicine

## 2023-10-07 DIAGNOSIS — J209 Acute bronchitis, unspecified: Secondary | ICD-10-CM | POA: Diagnosis not present

## 2023-10-07 DIAGNOSIS — J039 Acute tonsillitis, unspecified: Secondary | ICD-10-CM | POA: Diagnosis not present

## 2023-10-09 ENCOUNTER — Ambulatory Visit: Payer: BC Managed Care – PPO | Admitting: Family Medicine

## 2023-10-09 DIAGNOSIS — J3501 Chronic tonsillitis: Secondary | ICD-10-CM | POA: Diagnosis not present

## 2023-10-09 DIAGNOSIS — J039 Acute tonsillitis, unspecified: Secondary | ICD-10-CM | POA: Diagnosis not present

## 2023-10-16 DIAGNOSIS — H1033 Unspecified acute conjunctivitis, bilateral: Secondary | ICD-10-CM | POA: Diagnosis not present

## 2023-11-06 ENCOUNTER — Ambulatory Visit (INDEPENDENT_AMBULATORY_CARE_PROVIDER_SITE_OTHER): Payer: BC Managed Care – PPO | Admitting: Family Medicine

## 2023-11-06 ENCOUNTER — Encounter: Payer: Self-pay | Admitting: Family Medicine

## 2023-11-06 VITALS — BP 110/82 | HR 98 | Temp 98.6°F | Ht 61.0 in | Wt 110.0 lb

## 2023-11-06 DIAGNOSIS — Z113 Encounter for screening for infections with a predominantly sexual mode of transmission: Secondary | ICD-10-CM

## 2023-11-06 NOTE — Patient Instructions (Signed)
See if your insurance covers HPV vaccine and if so we can do your 3rd HPV   STD screening today  Use safe sexual practices when you can    Let us know if you develop any symptoms

## 2023-11-06 NOTE — Assessment & Plan Note (Signed)
2 partners Pt is allergic to latex/condoms and sometimes uses lambskin when she can get them  No symptoms or known contact Labs today for screening  Gc/chlam Rpr, hep B and C   Is utd with pap and HPV screen   Needs 3rd HPV vaccine (missed when she got pregnant) , will see if ins covers at her age and will get here or at gyn or at health dept

## 2023-11-06 NOTE — Progress Notes (Signed)
Subjective:    Patient ID: Alexis Bolton, female    DOB: July 16, 1996, 27 y.o.   MRN: [id number]  HPI  Wt Readings from Last 3 Encounters:  11/06/23 110 lb (49.9 kg)  09/05/23 99 lb (44.9 kg)  05/14/23 99 lb (44.9 kg)   20.78 kg/m  Vitals:   11/06/23 0800  BP: 110/82  Pulse: 98  Temp: 98.6 F (37 C)  SpO2: 100%    Pt presents for STD screening   Is allergic to condoms and latex  Uses lambskin ones when she can get them/ hard to get   Wants to get screening   2 partners   No symptoms at all   Has IUD for contraception  Some irregular bleeding/ brown discharge but as a rule no regular menses   No new discharge  No itching or burning  No lesions No pelvic pain   Utd for pap and regular pap   Thinks she had one HPV vaccine       Patient Active Problem List   Diagnosis Date Noted   Viral wart on finger 09/05/2023   Mirena IUD (intrauterine device) in place since 04/11/23 04/11/2023   Eczema 02/21/2023   Vitamin D deficiency 01/04/2023   Recurrent tonsillitis 10/05/2022   Acute knee pain 04/12/2021   Screening examination for STD (sexually transmitted disease) 08/24/2020   Seasonal allergic rhinitis 07/31/2016   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   Past Surgical History:  Procedure Laterality Date   NO PAST SURGERIES     Social History   Tobacco Use   Smoking status: Never    Passive exposure: Never   Smokeless tobacco: Never  Substance Use Topics   Alcohol use: No    Alcohol/week: 0.0 standard drinks of alcohol   Drug use: No   Family History  Family history unknown: Yes   Allergies  Allergen Reactions   Latex    Mosquito (Culex Pipiens) Allergy Skin Test Itching and Swelling   Other     Nuts, spring mix causes itchy throat   Penicillins    Pomegranate [Punica]     Itchy throat   Current Outpatient Medications on File Prior to Visit  Medication Sig Dispense Refill    acetaminophen (TYLENOL) 325 MG tablet Take 650 mg by mouth every 6 (six) hours as needed.     azelastine (ASTELIN) 0.1 % nasal spray as needed.     ergocalciferol (VITAMIN D2) 1.25 MG (50000 UT) capsule Take 1 capsule (50,000 Units total) by mouth once a week. 12 capsule 0   ferrous sulfate 325 (65 FE) MG EC tablet TAKE 1 TABLET BY MOUTH EVERY DAY WITH FOOD 90 tablet 0   fluticasone (FLONASE) 50 MCG/ACT nasal spray as needed.     levocetirizine (XYZAL) 5 MG tablet SMARTSIG:1 Tablet(s) By Mouth Every Evening     levonorgestrel (MIRENA) 20 MCG/DAY IUD 1 each by Intrauterine route once.     montelukast (SINGULAIR) 10 MG tablet Take 1 tablet by mouth at bedtime.     sertraline (ZOLOFT) 50 MG tablet TAKE 1 TABLET BY MOUTH EVERY DAY 90 tablet 0   triamcinolone cream (KENALOG) 0.1 % Apply 1 Application topically 2 (two) times daily. Apply to affected area as needed for allergic reaction /eczema 30 g 0   No current facility-administered medications on file prior to visit.    Review of Systems  Constitutional:  Negative for  activity change, appetite change, fatigue, fever and unexpected weight change.  HENT:  Negative for congestion, ear pain, rhinorrhea, sinus pressure and sore throat.   Eyes:  Negative for pain, redness and visual disturbance.  Respiratory:  Negative for cough, shortness of breath and wheezing.   Cardiovascular:  Negative for chest pain and palpitations.  Gastrointestinal:  Negative for abdominal pain, blood in stool, constipation and diarrhea.  Endocrine: Negative for polydipsia and polyuria.  Genitourinary:  Negative for difficulty urinating, dyspareunia, dysuria, frequency, genital sores, urgency, vaginal discharge and vaginal pain.  Musculoskeletal:  Negative for arthralgias, back pain and myalgias.  Skin:  Negative for pallor and rash.  Allergic/Immunologic: Negative for environmental allergies.  Neurological:  Negative for dizziness, syncope and headaches.  Hematological:   Negative for adenopathy. Does not bruise/bleed easily.  Psychiatric/Behavioral:  Negative for decreased concentration and dysphoric mood. The patient is not nervous/anxious.        Objective:   Physical Exam Constitutional:      General: She is not in acute distress.    Appearance: Normal appearance. She is normal weight. She is not ill-appearing.  Eyes:     General:        Right eye: No discharge.        Left eye: No discharge.     Conjunctiva/sclera: Conjunctivae normal.     Pupils: Pupils are equal, round, and reactive to light.  Cardiovascular:     Rate and Rhythm: Regular rhythm. Tachycardia present.  Pulmonary:     Effort: Pulmonary effort is normal. No respiratory distress.  Abdominal:     General: Abdomen is flat. Bowel sounds are normal. There is no distension.     Palpations: There is no mass.     Tenderness: There is no abdominal tenderness.     Comments: No suprapubic tenderness or fullness    Skin:    Findings: No rash.  Neurological:     Mental Status: She is alert.  Psychiatric:        Mood and Affect: Mood normal.           Assessment & Plan:   Problem List Items Addressed This Visit       Other   Screening examination for STD (sexually transmitted disease) - Primary    2 partners Pt is allergic to latex/condoms and sometimes uses lambskin when she can get them  No symptoms or known contact Labs today for screening  Gc/chlam Rpr, hep B and C   Is utd with pap and HPV screen   Needs 3rd HPV vaccine (missed when she got pregnant) , will see if ins covers at her age and will get here or at gyn or at health dept      Relevant Orders   C. trachomatis/N. gonorrhoeae RNA   Hepatitis C antibody   HIV Antibody (routine testing w rflx)   RPR   Hepatitis B surface antigen

## 2023-11-07 LAB — C. TRACHOMATIS/N. GONORRHOEAE RNA
C. trachomatis RNA, TMA: NOT DETECTED
N. gonorrhoeae RNA, TMA: NOT DETECTED

## 2023-11-07 LAB — HIV ANTIBODY (ROUTINE TESTING W REFLEX): HIV 1&2 Ab, 4th Generation: NONREACTIVE

## 2023-11-07 LAB — HEPATITIS C ANTIBODY: Hepatitis C Ab: NONREACTIVE

## 2023-11-07 LAB — RPR: RPR Ser Ql: NONREACTIVE

## 2023-11-07 LAB — HEPATITIS B SURFACE ANTIGEN: Hepatitis B Surface Ag: NONREACTIVE

## 2023-12-07 ENCOUNTER — Ambulatory Visit
Admission: EM | Admit: 2023-12-07 | Discharge: 2023-12-07 | Disposition: A | Discharge status: Home / Self Care | Payer: BC Managed Care – PPO | Attending: Emergency Medicine | Admitting: Emergency Medicine

## 2023-12-07 ENCOUNTER — Other Ambulatory Visit: Payer: Self-pay

## 2023-12-07 ENCOUNTER — Encounter: Payer: Self-pay | Admitting: *Deleted

## 2023-12-07 DIAGNOSIS — Z113 Encounter for screening for infections with a predominantly sexual mode of transmission: Secondary | ICD-10-CM | POA: Diagnosis not present

## 2023-12-07 DIAGNOSIS — Z202 Contact with and (suspected) exposure to infections with a predominantly sexual mode of transmission: Secondary | ICD-10-CM | POA: Insufficient documentation

## 2023-12-07 NOTE — ED Triage Notes (Signed)
 States she was tested for STI s in Dec and was neg. Since then she may have exposed to Chlamydia

## 2023-12-07 NOTE — Discharge Instructions (Signed)
Labs pending 2-3 days, you will be contacted if positive for any sti and treatment will be sent to the pharmacy, you will have to return to the clinic if positive for gonorrhea to receive treatment   Please refrain from having sex until labs results, if positive please refrain from having sex until treatment complete and symptoms resolve   If positive for , Chlamydia  gonorrhea or trichomoniasis please notify partner or partners so they may tested as well  Moving forward, it is recommended you use some form of protection against the transmission of sti infections  such as condoms or dental dams with each sexual encounter   

## 2023-12-07 NOTE — ED Provider Notes (Signed)
 Alexis Bolton    CSN: [id number] Arrival date & time: 12/07/23  1813      History   Chief Complaint Chief Complaint  Patient presents with   Exposure to STD    HPI Alexis Bolton is a 28 y.o. female.   Patient presents for evaluation for routine STI testing.  Endorses partner notified her of exposure to chlamydia however he was not tested.  Denies all symptoms at this time.  Has IUD placed.  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    Patient Active Problem List   Diagnosis Date Noted   Viral wart on finger 09/05/2023   Mirena  IUD (intrauterine device) in place since 04/11/23 04/11/2023   Eczema 02/21/2023   Vitamin D  deficiency 01/04/2023   Recurrent tonsillitis 10/05/2022   Acute knee pain 04/12/2021   Screening examination for STD (sexually transmitted disease) 08/24/2020   Seasonal allergic rhinitis 07/31/2016    Past Surgical History:  Procedure Laterality Date   NO PAST SURGERIES      OB History     Gravida  1   Para  1   Term  1   Preterm      AB      Living  1      SAB      IAB      Ectopic      Multiple  0   Live Births  1            Home Medications    Prior to Admission medications   Medication Sig Start Date End Date Taking? Authorizing Provider  levocetirizine (XYZAL) 5 MG tablet SMARTSIG:1 Tablet(s) By Mouth Every Evening 04/01/21  Yes [provider]  acetaminophen  (TYLENOL ) 325 MG tablet Take 650 mg by mouth every 6 (six) hours as needed.    [provider]  azelastine (ASTELIN) 0.1 % nasal spray as needed. 04/01/21   [provider]  ergocalciferol  (VITAMIN D2) 1.25 MG (50000 UT) capsule Take 1 capsule (50,000 Units total) by mouth once a week. 01/04/23   Tower, Laine LABOR, MD  ferrous sulfate  325 (65 FE) MG EC tablet TAKE 1 TABLET BY MOUTH EVERY DAY WITH FOOD 01/29/23   Tower, Laine LABOR, MD  fluticasone (FLONASE) 50 MCG/ACT nasal spray as  needed.    [provider]  levonorgestrel  (MIRENA ) 20 MCG/DAY IUD 1 each by Intrauterine route once. 04/04/23   [provider]  montelukast (SINGULAIR) 10 MG tablet Take 1 tablet by mouth at bedtime. 04/01/21   [provider]  sertraline  (ZOLOFT ) 50 MG tablet TAKE 1 TABLET BY MOUTH EVERY DAY 10/08/23   Tower, Laine LABOR, MD  triamcinolone  cream (KENALOG ) 0.1 % Apply 1 Application topically 2 (two) times daily. Apply to affected area as needed for allergic reaction astrid 02/21/23 02/21/24  Tower, Laine LABOR, MD    Family History Family History  Family history unknown: Yes    Social History Social History   Tobacco Use   Smoking status: Never    Passive exposure: Never   Smokeless tobacco: Never  Substance Use Topics   Alcohol use: No    Alcohol/week: 0.0 standard drinks of alcohol   Drug use: No     Allergies   Latex, Mosquito (culex pipiens) allergy skin test, Other, Penicillins, and Pomegranate [punica]   Review of Systems Review of Systems   Physical Exam Triage Vital Signs ED Triage  Vitals  Encounter Vitals Group     BP 12/07/23 1824 131/85     Systolic BP Percentile --      Diastolic BP Percentile --      Pulse Rate 12/07/23 1824 (!) 106     Resp 12/07/23 1824 18     Temp 12/07/23 1824 98.6 F (37 C)     Temp Source 12/07/23 1824 Oral     SpO2 12/07/23 1824 100 %     Weight --      Height --      Head Circumference --      Peak Flow --      Pain Score 12/07/23 1835 0     Pain Loc --      Pain Education --      Exclude from Growth Chart --    No data found.  Updated Vital Signs BP 131/85 (BP Location: Left Arm)   Pulse (!) 106   Temp 98.6 F (37 C) (Oral)   Resp 18   SpO2 100%   Visual Acuity Right Eye Distance:   Left Eye Distance:   Bilateral Distance:    Right Eye Near:   Left Eye Near:    Bilateral Near:     Physical Exam Constitutional:      Appearance: Normal appearance.  Eyes:     Extraocular Movements:  Extraocular movements intact.  Pulmonary:     Effort: Pulmonary effort is normal.  Genitourinary:    Comments: deferred Neurological:     Mental Status: She is alert and oriented to person, place, and time. Mental status is at baseline.      UC Treatments / Results  Labs (all labs ordered are listed, but only abnormal results are displayed) Labs Reviewed  CERVICOVAGINAL ANCILLARY ONLY    EKG   Radiology No results found.  Procedures Procedures (including critical care time)  Medications Ordered in UC Medications - No data to display  Initial Impression / Assessment and Plan / UC Course  I have reviewed the triage vital signs and the nursing notes.  Pertinent labs & imaging results that were available during my care of the patient were reviewed by me and considered in my medical decision making (see chart for details).  Routine screening for STI  STI labs pending will treat per protocol, advised abstinence until lab results, and/or treatment is complete, advised condom use during all sexual encounters moving, may follow-up with urgent care as needed  Final Clinical Impressions(s) / UC Diagnoses   Final diagnoses:  Routine screening for STI (sexually transmitted infection)     Discharge Instructions      Labs pending 2-3 days, you will be contacted if positive for any sti and treatment will be sent to the pharmacy, you will have to return to the clinic if positive for gonorrhea to receive treatment   Please refrain from having sex until labs results, if positive please refrain from having sex until treatment complete and symptoms resolve   If positive for , Chlamydia  gonorrhea or trichomoniasis please notify partner or partners so they may tested as well  Moving forward, it is recommended you use some form of protection against the transmission of sti infections  such as condoms or dental dams with each sexual encounter     ED Prescriptions   None    PDMP  not reviewed this encounter.   Teresa Shelba SAUNDERS, NP 12/07/23 1900

## 2023-12-11 LAB — CERVICOVAGINAL ANCILLARY ONLY
Bacterial Vaginitis (gardnerella): POSITIVE — AB
Candida Glabrata: NEGATIVE
Candida Vaginitis: NEGATIVE
Chlamydia: NEGATIVE
Comment: NEGATIVE
Comment: NEGATIVE
Comment: NEGATIVE
Comment: NEGATIVE
Comment: NEGATIVE
Comment: NORMAL
Neisseria Gonorrhea: NEGATIVE
Trichomonas: NEGATIVE

## 2024-01-03 ENCOUNTER — Other Ambulatory Visit: Payer: Self-pay | Admitting: Family Medicine

## 2024-01-03 NOTE — Telephone Encounter (Signed)
Pt has had a few acute appts but no recent or future CPE or f/u appts

## 2024-04-03 DIAGNOSIS — A549 Gonococcal infection, unspecified: Secondary | ICD-10-CM | POA: Diagnosis not present

## 2024-04-03 DIAGNOSIS — Z7251 High risk heterosexual behavior: Secondary | ICD-10-CM | POA: Diagnosis not present

## 2024-06-02 DIAGNOSIS — M222X2 Patellofemoral disorders, left knee: Secondary | ICD-10-CM | POA: Diagnosis not present

## 2024-06-02 DIAGNOSIS — S86912A Strain of unspecified muscle(s) and tendon(s) at lower leg level, left leg, initial encounter: Secondary | ICD-10-CM | POA: Diagnosis not present

## 2024-06-17 DIAGNOSIS — S86912D Strain of unspecified muscle(s) and tendon(s) at lower leg level, left leg, subsequent encounter: Secondary | ICD-10-CM | POA: Diagnosis not present

## 2024-06-19 DIAGNOSIS — M222X2 Patellofemoral disorders, left knee: Secondary | ICD-10-CM | POA: Diagnosis not present

## 2024-06-19 DIAGNOSIS — S86912D Strain of unspecified muscle(s) and tendon(s) at lower leg level, left leg, subsequent encounter: Secondary | ICD-10-CM | POA: Diagnosis not present

## 2024-06-20 ENCOUNTER — Encounter: Payer: Self-pay | Admitting: Advanced Practice Midwife

## 2024-06-26 DIAGNOSIS — S86912D Strain of unspecified muscle(s) and tendon(s) at lower leg level, left leg, subsequent encounter: Secondary | ICD-10-CM | POA: Diagnosis not present

## 2024-07-01 DIAGNOSIS — S86912D Strain of unspecified muscle(s) and tendon(s) at lower leg level, left leg, subsequent encounter: Secondary | ICD-10-CM | POA: Diagnosis not present
